# Patient Record
Sex: Female | Born: 2000 | Race: White | Hispanic: No | Marital: Single | Smoking: Never smoker
Health system: Southern US, Community
[De-identification: ages and names within clinical notes are randomized; demographics above are authoritative.]

## PROBLEM LIST (undated history)

## (undated) DIAGNOSIS — K3184 Gastroparesis: Secondary | ICD-10-CM

## (undated) DIAGNOSIS — I498 Other specified cardiac arrhythmias: Secondary | ICD-10-CM

## (undated) DIAGNOSIS — F329 Major depressive disorder, single episode, unspecified: Secondary | ICD-10-CM

## (undated) DIAGNOSIS — I951 Orthostatic hypotension: Secondary | ICD-10-CM

## (undated) DIAGNOSIS — R5382 Chronic fatigue, unspecified: Secondary | ICD-10-CM

## (undated) DIAGNOSIS — F32A Depression, unspecified: Secondary | ICD-10-CM

## (undated) DIAGNOSIS — R000 Tachycardia, unspecified: Secondary | ICD-10-CM

## (undated) DIAGNOSIS — F319 Bipolar disorder, unspecified: Secondary | ICD-10-CM

## (undated) DIAGNOSIS — F419 Anxiety disorder, unspecified: Secondary | ICD-10-CM

## (undated) DIAGNOSIS — G90A Postural orthostatic tachycardia syndrome (POTS): Secondary | ICD-10-CM

---
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
## 2018-04-23 ENCOUNTER — Emergency Department (HOSPITAL_BASED_OUTPATIENT_CLINIC_OR_DEPARTMENT_OTHER)
Admission: EM | Admit: 2018-04-23 | Discharge: 2018-04-23 | Disposition: A | Discharge status: Home / Self Care | Payer: PRIVATE HEALTH INSURANCE | Attending: Emergency Medicine | Admitting: Emergency Medicine

## 2018-04-23 ENCOUNTER — Encounter (HOSPITAL_BASED_OUTPATIENT_CLINIC_OR_DEPARTMENT_OTHER): Payer: Self-pay | Admitting: Emergency Medicine

## 2018-04-23 ENCOUNTER — Other Ambulatory Visit: Payer: Self-pay

## 2018-04-23 DIAGNOSIS — Z79899 Other long term (current) drug therapy: Secondary | ICD-10-CM | POA: Diagnosis not present

## 2018-04-23 DIAGNOSIS — R112 Nausea with vomiting, unspecified: Secondary | ICD-10-CM | POA: Insufficient documentation

## 2018-04-23 HISTORY — DX: Chronic fatigue, unspecified: R53.82

## 2018-04-23 HISTORY — DX: Other specified cardiac arrhythmias: I49.8

## 2018-04-23 HISTORY — DX: Postural orthostatic tachycardia syndrome (POTS): G90.A

## 2018-04-23 HISTORY — DX: Bipolar disorder, unspecified: F31.9

## 2018-04-23 HISTORY — DX: Tachycardia, unspecified: R00.0

## 2018-04-23 HISTORY — DX: Orthostatic hypotension: I95.1

## 2018-04-23 LAB — CBC WITH DIFFERENTIAL/PLATELET
Basophils Absolute: 0.1 10*3/uL (ref 0.0–0.1)
Basophils Relative: 1 %
Eosinophils Absolute: 0.1 10*3/uL (ref 0.0–0.7)
Eosinophils Relative: 2 %
HCT: 39.8 % (ref 36.0–46.0)
Hemoglobin: 13.2 g/dL (ref 12.0–15.0)
Lymphocytes Relative: 19 %
Lymphs Abs: 1.4 10*3/uL (ref 0.7–4.0)
MCH: 31.6 pg (ref 26.0–34.0)
MCHC: 33.2 g/dL (ref 30.0–36.0)
MCV: 95.2 fL (ref 78.0–100.0)
Monocytes Absolute: 0.6 10*3/uL (ref 0.1–1.0)
Monocytes Relative: 8 %
Neutro Abs: 5.1 10*3/uL (ref 1.7–7.7)
Neutrophils Relative %: 70 %
Platelets: 313 10*3/uL (ref 150–400)
RBC: 4.18 MIL/uL (ref 3.87–5.11)
RDW: 12.7 % (ref 11.5–15.5)
WBC: 7.3 10*3/uL (ref 4.0–10.5)

## 2018-04-23 LAB — COMPREHENSIVE METABOLIC PANEL
ALT: 14 U/L (ref 0–44)
AST: 22 U/L (ref 15–41)
Albumin: 4.5 g/dL (ref 3.5–5.0)
Alkaline Phosphatase: 70 U/L (ref 38–126)
Anion gap: 9 (ref 5–15)
BUN: 12 mg/dL (ref 6–20)
CO2: 26 mmol/L (ref 22–32)
Calcium: 9.3 mg/dL (ref 8.9–10.3)
Chloride: 104 mmol/L (ref 98–111)
Creatinine, Ser: 0.68 mg/dL (ref 0.44–1.00)
GFR calc Af Amer: >60 mL/min (ref >60)
GFR calc non Af Amer: >60 mL/min (ref >60)
Glucose, Bld: 95 mg/dL (ref 70–99)
Potassium: 4.2 mmol/L (ref 3.5–5.1)
Sodium: 139 mmol/L (ref 135–145)
Total Bilirubin: 0.6 mg/dL (ref 0.3–1.2)
Total Protein: 7.4 g/dL (ref 6.5–8.1)

## 2018-04-23 LAB — URINALYSIS, ROUTINE W REFLEX MICROSCOPIC
Bilirubin Urine: NEGATIVE
Glucose, UA: NEGATIVE mg/dL
Hgb urine dipstick: NEGATIVE
Ketones, ur: NEGATIVE mg/dL
Leukocytes, UA: NEGATIVE
Nitrite: NEGATIVE
Protein, ur: NEGATIVE mg/dL
Specific Gravity, Urine: 1.015 (ref 1.005–1.030)
pH: 8.5 — ABNORMAL HIGH (ref 5.0–8.0)

## 2018-04-23 LAB — LIPASE, BLOOD: Lipase: 28 U/L (ref 11–51)

## 2018-04-23 LAB — PREGNANCY, URINE: Preg Test, Ur: NEGATIVE

## 2018-04-23 MED ORDER — SODIUM CHLORIDE 0.9 % IV BOLUS
1000.0000 mL | Freq: Once | INTRAVENOUS | Status: AC
  Administered 2018-04-23: 1000 mL via INTRAVENOUS

## 2018-04-23 MED ORDER — ONDANSETRON HCL 4 MG/2ML IJ SOLN
4.0000 mg | Freq: Once | INTRAMUSCULAR | Status: AC
  Administered 2018-04-23: 4 mg via INTRAVENOUS
  Filled 2018-04-23: qty 2

## 2018-04-23 MED ORDER — ONDANSETRON HCL 4 MG PO TABS: 4.0000 mg | ORAL_TABLET | Freq: Four times a day (QID) | ORAL | 0 refills | Status: DC

## 2018-04-23 NOTE — ED Notes (Signed)
Pt drinking water without problem.

## 2018-04-23 NOTE — ED Notes (Signed)
Pt states still unable to provide urine specimen. Water given per Engineer, site from [place], [place]

## 2018-04-23 NOTE — ED Notes (Signed)
Pt unable to void 

## 2018-04-23 NOTE — ED Triage Notes (Signed)
N/V since yesterday.  No abd pain.  Sts she has been out of her adderal x1 week and is wondering if it is withdrawal.  Waiting for rx through the mail from [place].

## 2018-04-23 NOTE — ED Provider Notes (Signed)
[facility] EMERGENCY DEPARTMENT Provider Note   CSN: [id number] Arrival date & time: 04/23/18  1303     History   Chief Complaint Chief Complaint  Patient presents with  . Emesis    HPI Amy Bradley is a 17 y.o. female with history of anxiety, bipolar disorder, POTS, chronic fatigue who presents with a 1 day history of nausea and vomiting. Patient has not been able to keep down any food or fluids.  She denies any abdominal pain or diarrhea.  She reports after a lot of vomiting, she has had some blood streaking in her emesis.  She denies any drug or alcohol use.  She reports she recently started on Adderall for her chronic fatigue syndrome and has been out of it for about a week.  She feels like she may be experiencing the symptoms as withdrawal symptoms.  She reports the medication is being mailed to her from a prescription service.  She denies eating any abnormal food.  She denies any new chest pain, shortness of breath, urinary symptoms, back pain.  No medications taken prior to arrival. No new or different foods or known sick contacts. No recent travel.  HPI  Past Medical History:  Diagnosis Date  . Anxiety   . Bipolar 1 disorder (HCC)   . Chronic fatigue   . Depression   . POTS (postural orthostatic tachycardia syndrome)     Patient Active Problem List   Diagnosis Date Noted  . Dizziness 06/23/2017  . Syncope 06/23/2017  . Chest pain 06/23/2017    History reviewed. No pertinent surgical history.   OB History   None      Home Medications    Prior to Admission medications   Medication Sig Start Date End Date Taking? Authorizing Provider  amphetamine-dextroamphetamine (ADDERALL) 10 MG tablet Take 10 mg by mouth daily with breakfast.   Yes [provider]  ARIPiprazole (ABILIFY) 10 MG tablet Take 10 mg by mouth daily.   Yes [provider]  LORazepam (ATIVAN) 0.5 MG tablet Take by mouth. 03/03/18 08/30/18 Yes [provider]    midodrine (PROAMATINE) 5 MG tablet Take 5 mg by mouth 2 (two) times daily with a meal.   Yes [provider]  pindolol (VISKEN) 5 MG tablet Take 5 mg by mouth 2 (two) times daily.   Yes [provider]  ondansetron (ZOFRAN) 4 MG tablet Take 1 tablet (4 mg total) by mouth every 6 (six) hours. 04/23/18   Aneshia Jacquet, Waylan Boga, PA-C  sertraline (ZOLOFT) 100 MG tablet Take 100 mg by mouth daily.    [provider]    Family History Family History  Problem Relation Age of Onset  . Lung cancer Father   . Heart disease Neg Hx   . Sudden Cardiac Death Neg Hx     Social History Social History   Tobacco Use  . Smoking status: Never Smoker  . Smokeless tobacco: Never Used  Substance Use Topics  . Alcohol use: No  . Drug use: No     Allergies   Sulfa antibiotics and Penicillins   Review of Systems Review of Systems  Constitutional: Negative for chills and fever.  HENT: Negative for facial swelling and sore throat.   Respiratory: Negative for shortness of breath.   Cardiovascular: Negative for chest pain.  Gastrointestinal: Positive for nausea and vomiting. Negative for abdominal pain, blood in stool and diarrhea.  Genitourinary: Negative for dysuria.  Musculoskeletal: Negative for back pain.  Skin: Negative  for rash and wound.  Neurological: Negative for headaches.  Psychiatric/Behavioral: The patient is not nervous/anxious.      Physical Exam Updated Vital Signs BP 122/70 (BP Location: Right Arm)   Pulse 76   Temp 98.8 F (37.1 C) (Oral)   Resp 18   Ht 5\' 8"  (1.727 m)   Wt 61.2 kg   LMP 03/23/2018   SpO2 100%   BMI 20.53 kg/m   Physical Exam  Constitutional: She appears well-developed and well-nourished. No distress.  HENT:  Head: Normocephalic and atraumatic.  Mouth/Throat: Oropharynx is clear and moist. No oropharyngeal exudate.  Eyes: Pupils are equal, round, and reactive to light. Conjunctivae are normal. Right eye exhibits no discharge.  Left eye exhibits no discharge. No scleral icterus.  Neck: Normal range of motion. Neck supple. No thyromegaly present.  Cardiovascular: Normal rate, regular rhythm, normal heart sounds and intact distal pulses. Exam reveals no gallop and no friction rub.  No murmur heard. Pulmonary/Chest: Effort normal and breath sounds normal. No stridor. No respiratory distress. She has no wheezes. She has no rales.  Abdominal: Soft. Bowel sounds are normal. She exhibits no distension. There is no tenderness. There is no rebound and no guarding.  Musculoskeletal: She exhibits no edema.  Lymphadenopathy:    She has no cervical adenopathy.  Neurological: She is alert. Coordination normal.  Skin: Skin is warm and dry. No rash noted. She is not diaphoretic. No pallor.  Psychiatric: She has a normal mood and affect.  Nursing note and vitals reviewed.    ED Treatments / Results  Labs (all labs ordered are listed, but only abnormal results are displayed) Labs Reviewed  URINALYSIS, ROUTINE W REFLEX MICROSCOPIC - Abnormal; Notable for the following components:      Result Value   pH 8.5 (*)    All other components within normal limits  COMPREHENSIVE METABOLIC PANEL  CBC WITH DIFFERENTIAL/PLATELET  LIPASE, BLOOD  PREGNANCY, URINE    EKG None  Radiology No results found.  Procedures Procedures (including critical care time)  Medications Ordered in ED Medications  sodium chloride 0.9 % bolus 1,000 mL (0 mLs Intravenous Stopped 04/23/18 1444)  ondansetron (ZOFRAN) injection 4 mg (4 mg Intravenous Given 04/23/18 1343)     Initial Impression / Assessment and Plan / ED Course  I have reviewed the triage vital signs and the nursing notes.  Pertinent labs & imaging results that were available during my care of the patient were reviewed by me and considered in my medical decision making (see chart for details).     Patient with viral gastroenteritis.  Low suspicion that this is a withdrawal symptom  of Adderall.  Patient denies any abdominal pain.  Abdominal exam is soft and nontender.  Labs are unremarkable.  Patient is given a liter fluids and tolerating oral fluids after Zofran in the ED.  Will discharge home with Zofran.  Progressive diet discussed.  Return precautions discussed.  Patient understands and agrees with plan.  Patient vitals stable throughout ED course and discharged in satisfactory condition.  Final Clinical Impressions(s) / ED Diagnoses   Final diagnoses:  Non-intractable vomiting with nausea, unspecified vomiting type    ED Discharge Orders         Ordered    ondansetron (ZOFRAN) 4 MG tablet  Every 6 hours     04/23/18 1609           Emi Holes, PA-C 04/23/18 1616    Raeford Razor, MD 04/25/18 [phone]

## 2018-04-23 NOTE — Discharge Instructions (Signed)
Take Zofran every 6 hours as needed for nausea or vomiting.  Begin with clear fluids and progress to bland foods such as bananas, rice, applesauce, toast and then progress your diet as tolerated.  Avoid greasy, fatty, spicy foods to begin with.  Please return the emergency department if you develop any new or worsening symptoms including intractable vomiting despite the medication, localized abdominal pain, fever over 100.4, or any other concerning symptom.

## 2018-05-06 ENCOUNTER — Encounter (HOSPITAL_BASED_OUTPATIENT_CLINIC_OR_DEPARTMENT_OTHER): Payer: Self-pay

## 2018-05-06 ENCOUNTER — Other Ambulatory Visit: Payer: Self-pay

## 2018-05-06 ENCOUNTER — Emergency Department (HOSPITAL_BASED_OUTPATIENT_CLINIC_OR_DEPARTMENT_OTHER)
Admission: EM | Admit: 2018-05-06 | Discharge: 2018-05-06 | Disposition: A | Discharge status: Home / Self Care | Payer: Managed Care, Other (non HMO) | Attending: Emergency Medicine | Admitting: Emergency Medicine

## 2018-05-06 ENCOUNTER — Emergency Department (HOSPITAL_BASED_OUTPATIENT_CLINIC_OR_DEPARTMENT_OTHER): Payer: Managed Care, Other (non HMO)

## 2018-05-06 DIAGNOSIS — Y929 Unspecified place or not applicable: Secondary | ICD-10-CM | POA: Insufficient documentation

## 2018-05-06 DIAGNOSIS — Y999 Unspecified external cause status: Secondary | ICD-10-CM | POA: Diagnosis not present

## 2018-05-06 DIAGNOSIS — Z87898 Personal history of other specified conditions: Secondary | ICD-10-CM

## 2018-05-06 DIAGNOSIS — S098XXA Other specified injuries of head, initial encounter: Secondary | ICD-10-CM | POA: Diagnosis present

## 2018-05-06 DIAGNOSIS — W19XXXA Unspecified fall, initial encounter: Secondary | ICD-10-CM | POA: Diagnosis not present

## 2018-05-06 DIAGNOSIS — Y9389 Activity, other specified: Secondary | ICD-10-CM | POA: Insufficient documentation

## 2018-05-06 DIAGNOSIS — Z79899 Other long term (current) drug therapy: Secondary | ICD-10-CM | POA: Insufficient documentation

## 2018-05-06 DIAGNOSIS — S060X1A Concussion with loss of consciousness of 30 minutes or less, initial encounter: Secondary | ICD-10-CM | POA: Insufficient documentation

## 2018-05-06 DIAGNOSIS — S0990XA Unspecified injury of head, initial encounter: Secondary | ICD-10-CM

## 2018-05-06 LAB — RAPID URINE DRUG SCREEN, HOSP PERFORMED
AMPHETAMINES: POSITIVE — AB
BENZODIAZEPINES: NOT DETECTED
Barbiturates: NOT DETECTED
Cocaine: NOT DETECTED
Opiates: NOT DETECTED
TETRAHYDROCANNABINOL: NOT DETECTED

## 2018-05-06 LAB — COMPREHENSIVE METABOLIC PANEL
ALBUMIN: 4.4 g/dL (ref 3.5–5.0)
ALT: 18 U/L (ref 0–44)
AST: 20 U/L (ref 15–41)
Alkaline Phosphatase: 73 U/L (ref 38–126)
Anion gap: 8 (ref 5–15)
BUN: 13 mg/dL (ref 6–20)
CALCIUM: 9.2 mg/dL (ref 8.9–10.3)
CHLORIDE: 103 mmol/L (ref 98–111)
CO2: 28 mmol/L (ref 22–32)
CREATININE: 0.75 mg/dL (ref 0.44–1.00)
GFR calc non Af Amer: >60 mL/min (ref >60)
GLUCOSE: 92 mg/dL (ref 70–99)
Potassium: 4.3 mmol/L (ref 3.5–5.1)
SODIUM: 139 mmol/L (ref 135–145)
Total Bilirubin: 0.6 mg/dL (ref 0.3–1.2)
Total Protein: 7.3 g/dL (ref 6.5–8.1)

## 2018-05-06 LAB — CBC WITH DIFFERENTIAL/PLATELET
BASOS PCT: 1 %
Basophils Absolute: 0 10*3/uL (ref 0.0–0.1)
EOS ABS: 0.2 10*3/uL (ref 0.0–0.7)
EOS PCT: 3 %
HCT: 38.3 % (ref 36.0–46.0)
Hemoglobin: 12.6 g/dL (ref 12.0–15.0)
LYMPHS ABS: 1.1 10*3/uL (ref 0.7–4.0)
Lymphocytes Relative: 19 %
MCH: 31.3 pg (ref 26.0–34.0)
MCHC: 32.9 g/dL (ref 30.0–36.0)
MCV: 95 fL (ref 78.0–100.0)
MONO ABS: 0.5 10*3/uL (ref 0.1–1.0)
Monocytes Relative: 8 %
NEUTROS PCT: 69 %
Neutro Abs: 4.1 10*3/uL (ref 1.7–7.7)
PLATELETS: 256 10*3/uL (ref 150–400)
RBC: 4.03 MIL/uL (ref 3.87–5.11)
RDW: 12.6 % (ref 11.5–15.5)
WBC: 5.9 10*3/uL (ref 4.0–10.5)

## 2018-05-06 LAB — URINALYSIS, ROUTINE W REFLEX MICROSCOPIC
BILIRUBIN URINE: NEGATIVE
Glucose, UA: NEGATIVE mg/dL
Hgb urine dipstick: NEGATIVE
Ketones, ur: NEGATIVE mg/dL
Leukocytes, UA: NEGATIVE
NITRITE: NEGATIVE
PH: 6.5 (ref 5.0–8.0)
Protein, ur: NEGATIVE mg/dL
SPECIFIC GRAVITY, URINE: 1.02 (ref 1.005–1.030)

## 2018-05-06 LAB — PREGNANCY, URINE: Preg Test, Ur: NEGATIVE

## 2018-05-06 LAB — ETHANOL: Alcohol, Ethyl (B): <10 mg/dL (ref <10)

## 2018-05-06 NOTE — ED Notes (Signed)
Pt returned from CT °

## 2018-05-06 NOTE — ED Notes (Signed)
Pt and visitor report that patient had multiple episodes of "myoclonic" seizure like activity. States last occurrence was this morning at approximately 1015.

## 2018-05-06 NOTE — Discharge Instructions (Addendum)
Your workup today was reassuring.  Please establish care with neurology in the area and follow up this week.  Please follow attached handout on concussions. Follow up with your PCP on Wednesday, 05/10/18 for re-evaluation.  If you develop worsening or new concerning symptoms you can return to the emergency department for re-evaluation.

## 2018-05-06 NOTE — ED Provider Notes (Signed)
[facility] EMERGENCY DEPARTMENT Provider Note   CSN: [id number] Arrival date & time: 05/06/18  1045     History   Chief Complaint Chief Complaint  Patient presents with  . Head Injury    HPI Amy Bradley is a 17 y.o. female with a history of pots, recurrent syncope, chronic dizziness, bipolar 1 disorder who presents the emergent department today for head injury.  Patient reports that on Wednesday night she was walking with a friend when she had a "pots episode" that caused her to faint and fall backwards, striking her head.  She reports she woke several minutes later with a headache and blurring of her vision.  She states her vision has since improved but she has had a ongoing headache daily since that time.  She states she is stayed indoors since Wednesday and has had several "pots episodes" where she is fainted but denies further head injury.  Her roommate/friend is present with her and states she has been by her side the entire time and confirms this.  Patient reports that last night when lying down for bed she started having full body jerks where her body would contract and all of her extremities which shake x1 movement.  She states she was awake during each of these episodes.  She reports that it is occurred at least 8 times since they began.  She reports she was awake and aware during each episode.  She denies signs of postictal.  No history of seizures.  Patient reports that she does have chronic dizziness, nausea/vomiting that occurs with her pots.  She denies any new symptoms.  She denies any current visual changes, neck pain, chest pain, shortness of breath, abdominal pain, bowel/bladder incontinence, urinary retention, extremity weakness.  She reports she is mainly wheelchair-bound secondary to her pots.  She is followed by a neurologist.  She has never been diagnosed or had seizure-like activity before.  She thinks she may have a concussion. She is here for school at  [place]. [place]   HPI  Past Medical History:  Diagnosis Date  . Anxiety   . Bipolar 1 disorder (HCC)   . Chronic fatigue   . Depression   . POTS (postural orthostatic tachycardia syndrome)     Patient Active Problem List   Diagnosis Date Noted  . Dizziness 06/23/2017  . Syncope 06/23/2017  . Chest pain 06/23/2017    History reviewed. No pertinent surgical history.   OB History   None      Home Medications    Prior to Admission medications   Medication Sig Start Date End Date Taking? Authorizing Provider  amphetamine-dextroamphetamine (ADDERALL) 10 MG tablet Take 10 mg by mouth daily with breakfast.    [provider]  ARIPiprazole (ABILIFY) 10 MG tablet Take 10 mg by mouth daily.    [provider]  LORazepam (ATIVAN) 0.5 MG tablet Take by mouth. 03/03/18 08/30/18  [provider]  midodrine (PROAMATINE) 5 MG tablet Take 5 mg by mouth 2 (two) times daily with a meal.    [provider]  ondansetron (ZOFRAN) 4 MG tablet Take 1 tablet (4 mg total) by mouth every 6 (six) hours. 04/23/18   Law, Waylan BogaAlexandra M, PA-C  pindolol (VISKEN) 5 MG tablet Take 5 mg by mouth 2 (two) times daily.    [provider]  sertraline (ZOLOFT) 100 MG tablet Take 100 mg by mouth daily.    [provider]    [facility]  [facility] Family History  Problem Relation Age of Onset  . Lung cancer Father   . Heart disease Neg Hx   . Sudden Cardiac Death Neg Hx     Social History Social History   Tobacco Use  . Smoking status: Never Smoker  . Smokeless tobacco: Never Used  Substance Use Topics  . Alcohol use: No  . Drug use: No     Allergies   Sulfa antibiotics and Penicillins   Review of Systems Review of Systems  All other systems reviewed and are negative.    Physical Exam Updated Vital Signs BP 112/75 (BP Location: Right Arm)   Pulse 72   Temp 98.8 F (37.1 C) (Oral)   Resp 16   Ht 5\' 8"  (1.727 m)   Wt 61.2 kg    SpO2 98%   BMI 20.53 kg/m   Physical Exam  Constitutional: She appears well-developed and well-nourished.  Non-toxic appearing  HENT:  Head: Normocephalic and atraumatic. Head is without raccoon's eyes and without Battle's sign.  Right Ear: Hearing, tympanic membrane, external ear and ear canal normal. Tympanic membrane is not perforated and not erythematous. No hemotympanum.  Left Ear: Hearing, tympanic membrane, external ear and ear canal normal. Tympanic membrane is not perforated and not erythematous. No hemotympanum.  Nose: Nose normal. No rhinorrhea. Right sinus exhibits no maxillary sinus tenderness and no frontal sinus tenderness. Left sinus exhibits no maxillary sinus tenderness and no frontal sinus tenderness.  Mouth/Throat: Uvula is midline, oropharynx is clear and moist and mucous membranes are normal.  No CSF otorrhea   Eyes: Pupils are equal, round, and reactive to light. Conjunctivae, EOM and lids are normal. Right eye exhibits no discharge. Left eye exhibits no discharge. Right conjunctiva is not injected. Left conjunctiva is not injected. No scleral icterus. Right eye exhibits normal extraocular motion and no nystagmus. Left eye exhibits normal extraocular motion and no nystagmus.  Neck: Trachea normal, normal range of motion, full passive range of motion without pain and phonation normal. Neck supple. No spinous process tenderness and no muscular tenderness present. No neck rigidity. No tracheal deviation and normal range of motion present.  No C-spine ttp or step offs.   Cardiovascular: Normal rate, regular rhythm, normal heart sounds and intact distal pulses.  Pulses:      Radial pulses are 2+ on the right side, and 2+ on the left side.       Dorsalis pedis pulses are 2+ on the right side, and 2+ on the left side.       Posterior tibial pulses are 2+ on the right side, and 2+ on the left side.  Pulmonary/Chest: Effort normal and breath sounds normal. No respiratory  distress.  Neurological: She is alert. She has normal strength and normal reflexes. No cranial nerve deficit or sensory deficit. She displays a negative Romberg sign. Coordination and gait normal.  Reflex Scores:      Bicep reflexes are 2+ on the right side and 2+ on the left side.      Patellar reflexes are 2+ on the right side and 2+ on the left side.      Achilles reflexes are 2+ on the right side and 2+ on the left side. Mental Status: Alert, oriented, thought content appropriate, able to give a coherent history. Speech fluent without evidence of aphasia. Able to follow 2 step commands without difficulty. Cranial Nerves: II: Peripheral visual fields grossly normal, pupils equal, round, reactive to light III,IV, VI: ptosis not present, extra-ocular  motions intact bilaterally V,VII: smile symmetric, eyebrows raise symmetric, facial light touch sensation equal VIII: hearing grossly normal to voice X: uvula elevates symmetrically XI: bilateral shoulder shrug symmetric and strong XII: midline tongue extension without fassiculations Motor: Normal tone. 5/5 in upper and lower extremities bilaterally including strong and equal grip strength and dorsiflexion/plantar flexion Sensory: Sensation intact to light touch in all extremities.  Deep Tendon Reflexes: 2+ and symmetric in the biceps and patella Cerebellar: normal finger-to-nose with bilateral upper extremities. Normal heel-to -shin balance bilaterally of the lower extremity. No pronator drift.  CV: distal pulses palpable throughout  Skin: She is not diaphoretic. No pallor.  Psychiatric: She has a normal mood and affect.  Nursing note and vitals reviewed.    ED Treatments / Results  Labs (all labs ordered are listed, but only abnormal results are displayed) Labs Reviewed  RAPID URINE DRUG SCREEN, HOSP PERFORMED - Abnormal; Notable for the following components:      Result Value   Amphetamines POSITIVE (*)    All other  components within normal limits  COMPREHENSIVE METABOLIC PANEL  ETHANOL  URINALYSIS, ROUTINE W REFLEX MICROSCOPIC  PREGNANCY, URINE  CBC WITH DIFFERENTIAL/PLATELET  CBC WITH DIFFERENTIAL/PLATELET    EKG None  Radiology Ct Head Wo Contrast  Result Date: 05/06/2018 CLINICAL DATA:  History of POTS with recent syncopal event and head injury, initial encounter EXAM: CT HEAD WITHOUT CONTRAST CT CERVICAL SPINE WITHOUT CONTRAST TECHNIQUE: Multidetector CT imaging of the head and cervical spine was performed following the standard protocol without intravenous contrast. Multiplanar CT image reconstructions of the cervical spine were also generated. COMPARISON:  None. FINDINGS: CT HEAD FINDINGS Brain: No evidence of acute infarction, hemorrhage, hydrocephalus, extra-axial collection or mass lesion/mass effect. Vascular: No hyperdense vessel or unexpected calcification. Skull: Normal. Negative for fracture or focal lesion. Sinuses/Orbits: No acute finding. Other: None. CT CERVICAL SPINE FINDINGS Alignment: Mild loss of the normal cervical lordosis is noted likely related to muscular spasm. Skull base and vertebrae: 7 cervical segments are well visualized. Vertebral body height is well maintained. No acute fracture or acute facet abnormality is identified. Soft tissues and spinal canal: Surrounding soft tissues are within normal limits. Upper chest: Visualized upper chest is unremarkable. Other: None IMPRESSION: CT of the head: Normal head CT. CT of cervical spine: Changes of mild muscular spasm without acute bony abnormality. Electronically Signed   By: Alcide Clever M.D.   On: 05/06/2018 12:35   Ct Cervical Spine Wo Contrast  Result Date: 05/06/2018 CLINICAL DATA:  History of POTS with recent syncopal event and head injury, initial encounter EXAM: CT HEAD WITHOUT CONTRAST CT CERVICAL SPINE WITHOUT CONTRAST TECHNIQUE: Multidetector CT imaging of the head and cervical spine was performed following the standard  protocol without intravenous contrast. Multiplanar CT image reconstructions of the cervical spine were also generated. COMPARISON:  None. FINDINGS: CT HEAD FINDINGS Brain: No evidence of acute infarction, hemorrhage, hydrocephalus, extra-axial collection or mass lesion/mass effect. Vascular: No hyperdense vessel or unexpected calcification. Skull: Normal. Negative for fracture or focal lesion. Sinuses/Orbits: No acute finding. Other: None. CT CERVICAL SPINE FINDINGS Alignment: Mild loss of the normal cervical lordosis is noted likely related to muscular spasm. Skull base and vertebrae: 7 cervical segments are well visualized. Vertebral body height is well maintained. No acute fracture or acute facet abnormality is identified. Soft tissues and spinal canal: Surrounding soft tissues are within normal limits. Upper chest: Visualized upper chest is unremarkable. Other: None IMPRESSION: CT of the head: Normal head CT.  CT of cervical spine: Changes of mild muscular spasm without acute bony abnormality. Electronically Signed   By: Alcide Clever M.D.   On: 05/06/2018 12:35    Procedures Procedures (including critical care time)  Medications Ordered in ED Medications - No data to display   Initial Impression / Assessment and Plan / ED Course  I have reviewed the triage vital signs and the nursing notes.  Pertinent labs & imaging results that were available during my care of the patient were reviewed by me and considered in my medical decision making (see chart for details).     17 year old female with a history of pots, recurrent syncope, chronic dizziness presenting for head injury.  Patient reports that on Wednesday she had a "pots episode" where she syncopized and fell backwards hitting her head.  She reports a headache since that time.  No further head injuries since that event.  Patient reports that last night she started having episodes were all of her muscles would tense and she would shake x1  movement.  She notes that she was awake during these episodes.  She denies postictal symptoms.  She denies any focal deficits.  No associated visual changes, vertigo, nausea/vomiting, neck pain, chest pain, abdominal pain, bowel/bladder incontinence, urinary retention.  Patient with normal neurologic exam.  Vital signs are reassuring. No hx seizures.   Labs and imaging reviewed and reassuring.  Patient with no evidence of focal deficits on physical exam and is at mental baseline.  Advised patient to follow-up with neurologist.  Her neurologist is in [place] so have referred her to let our neurology.  Patient has tried restrictions until cleared by a neurologist. She states that she has these restrictions already secondary to her pots. Will tx with concussion protocol and have follow up with pcp in 1 week as well. Patient verbalizes understanding.  Time was given for all questions to be answered.  Patient is hemodynamically stable in no acute distress and appears safe for discharge.  Case discussed with Dr. Deretha Emory who is in agreement with plan.  Final Clinical Impressions(s) / ED Diagnoses   Final diagnoses:  Injury of head, initial encounter  History of syncope  Concussion with loss of consciousness of 30 minutes or less, initial encounter    ED Discharge Orders    None       Princella Pellegrini 05/06/18 1724    Vanetta Mulders, MD 05/07/18 0730

## 2018-05-06 NOTE — ED Triage Notes (Signed)
Pt reports head injury a few days ago. Sts ?possible seizure like activity last night and this morning.

## 2018-05-06 NOTE — ED Notes (Signed)
Patient transported to CT 

## 2018-05-06 NOTE — ED Notes (Signed)
ED Provider at bedside. 

## 2018-05-06 NOTE — ED Notes (Signed)
Lab reports that CBC was hemolyzed. New specimen collected.

## 2018-07-01 ENCOUNTER — Encounter (HOSPITAL_COMMUNITY): Payer: Self-pay | Admitting: Emergency Medicine

## 2018-07-01 ENCOUNTER — Other Ambulatory Visit: Payer: Self-pay

## 2018-07-01 ENCOUNTER — Emergency Department (HOSPITAL_COMMUNITY)
Admission: EM | Admit: 2018-07-01 | Discharge: 2018-07-01 | Disposition: A | Discharge status: Home / Self Care | Payer: Managed Care, Other (non HMO) | Attending: Emergency Medicine | Admitting: Emergency Medicine

## 2018-07-01 DIAGNOSIS — Z79899 Other long term (current) drug therapy: Secondary | ICD-10-CM | POA: Insufficient documentation

## 2018-07-01 DIAGNOSIS — R45851 Suicidal ideations: Secondary | ICD-10-CM

## 2018-07-01 DIAGNOSIS — F314 Bipolar disorder, current episode depressed, severe, without psychotic features: Secondary | ICD-10-CM | POA: Insufficient documentation

## 2018-07-01 LAB — SALICYLATE LEVEL

## 2018-07-01 LAB — RAPID URINE DRUG SCREEN, HOSP PERFORMED
Amphetamines: NOT DETECTED
BARBITURATES: NOT DETECTED
Benzodiazepines: NOT DETECTED
Cocaine: NOT DETECTED
OPIATES: NOT DETECTED
Tetrahydrocannabinol: NOT DETECTED

## 2018-07-01 LAB — COMPREHENSIVE METABOLIC PANEL
ALT: 13 U/L (ref 0–44)
AST: 19 U/L (ref 15–41)
Albumin: 4.3 g/dL (ref 3.5–5.0)
Alkaline Phosphatase: 70 U/L (ref 38–126)
Anion gap: 7 (ref 5–15)
BUN: 13 mg/dL (ref 6–20)
CHLORIDE: 108 mmol/L (ref 98–111)
CO2: 24 mmol/L (ref 22–32)
Calcium: 9.3 mg/dL (ref 8.9–10.3)
Creatinine, Ser: 0.73 mg/dL (ref 0.44–1.00)
GFR calc Af Amer: >60 mL/min (ref >60)
Glucose, Bld: 95 mg/dL (ref 70–99)
POTASSIUM: 3.8 mmol/L (ref 3.5–5.1)
SODIUM: 139 mmol/L (ref 135–145)
TOTAL PROTEIN: 6.9 g/dL (ref 6.5–8.1)
Total Bilirubin: 0.3 mg/dL (ref 0.3–1.2)

## 2018-07-01 LAB — CBC
HEMATOCRIT: 38.5 % (ref 36.0–46.0)
Hemoglobin: 12.5 g/dL (ref 12.0–15.0)
MCH: 31.4 pg (ref 26.0–34.0)
MCHC: 32.5 g/dL (ref 30.0–36.0)
MCV: 96.7 fL (ref 80.0–100.0)
PLATELETS: 327 10*3/uL (ref 150–400)
RBC: 3.98 MIL/uL (ref 3.87–5.11)
RDW: 12.1 % (ref 11.5–15.5)
WBC: 5.5 10*3/uL (ref 4.0–10.5)
nRBC: 0 % (ref 0.0–0.2)

## 2018-07-01 LAB — I-STAT BETA HCG BLOOD, ED (MC, WL, AP ONLY): I-stat hCG, quantitative: <5 m[IU]/mL (ref <5)

## 2018-07-01 LAB — ETHANOL

## 2018-07-01 LAB — ACETAMINOPHEN LEVEL

## 2018-07-01 MED ORDER — MIDODRINE HCL 5 MG PO TABS
5.0000 mg | ORAL_TABLET | Freq: Two times a day (BID) | ORAL | Status: DC
  Administered 2018-07-01: 5 mg via ORAL
  Filled 2018-07-01: qty 1

## 2018-07-01 MED ORDER — PINDOLOL 5 MG PO TABS
2.5000 mg | ORAL_TABLET | Freq: Two times a day (BID) | ORAL | Status: DC
  Administered 2018-07-01: 2.5 mg via ORAL
  Filled 2018-07-01: qty 1

## 2018-07-01 MED ORDER — SERTRALINE HCL 100 MG PO TABS
200.0000 mg | ORAL_TABLET | Freq: Every day | ORAL | Status: DC
  Administered 2018-07-01: 200 mg via ORAL
  Filled 2018-07-01: qty 2

## 2018-07-01 MED ORDER — ARIPIPRAZOLE 10 MG PO TABS
20.0000 mg | ORAL_TABLET | Freq: Every day | ORAL | Status: DC
  Administered 2018-07-01: 20 mg via ORAL
  Filled 2018-07-01: qty 2

## 2018-07-01 MED ORDER — LORAZEPAM 0.5 MG PO TABS: 0.5000 mg | ORAL_TABLET | ORAL | Status: DC | PRN

## 2018-07-01 MED ORDER — AMPHETAMINE-DEXTROAMPHETAMINE 10 MG PO TABS
10.0000 mg | ORAL_TABLET | Freq: Every day | ORAL | Status: DC
  Administered 2018-07-01: 10 mg via ORAL
  Filled 2018-07-01: qty 1

## 2018-07-01 NOTE — ED Notes (Signed)
Pt's friend - Earnest Conroy - [phone].

## 2018-07-01 NOTE — ED Notes (Signed)
Pt aware of tx plan to discuss Abilify monthly injections.

## 2018-07-01 NOTE — ED Notes (Signed)
Telepsych being performed. 

## 2018-07-01 NOTE — ED Provider Notes (Signed)
Received a call from Or[place] from [facility].  After interviewing the patient and getting collateral information from her roommate they feel that she can be safely discharged back to home.  They are only waiting for her ride and clothes.  This may be later today.   Terrilee Files, MD 07/01/18 [phone]

## 2018-07-01 NOTE — ED Notes (Signed)
Pt aware of tx plan - d/c to home. Aware her roommate w/vehicle will not be back in town until 1830 this evening. Pt states she will get an Benedetto Goad - staff will give her blue paper scrubs and blanket d/t her roommate took all of her belongings except cell phone last evening.

## 2018-07-01 NOTE — Discharge Instructions (Signed)
You were evaluated in the emergency department for suicidal thoughts.  You are evaluated by behavioral health and they felt that you could be discharged safely.  Please return if any concerns.

## 2018-07-01 NOTE — Progress Notes (Signed)
Patient is seen by me via tele-psych and have consulted with Dr. Lucianne Muss.  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  She reports today that there is no plan or intent of harming herself and she consistently has passive SI.  She denies any homicidal ideations and denies any hallucinations.  She reports that she does have a roommate that is very well aware of her history and that her roommate helps assist her with her medications and taking care of her.  She reports that she does not feel that she needs to be in the hospital and that due to a past traumatic experience inside of the hospital it would not be beneficial to her.  She reports that her psychiatrist yesterday increased her Abilify to 20 mg a day and she feels that that may assist her.  She reports that she only came to the hospital because of some manic symptoms and she was becoming worried.  She allowed me to contact her roommate Earnest Conroy, [phone], and her roommate reported the exact same story and stated that she felt that the patient was safe to be returned home and that the patient is usually very cautious about her behavior and her mental health issues.  She states that the patient would definitely let us know if she felt that she needed to be in the hospital.  Patient's roommate also states that they have removed all dangerous weapons from the apartment.  She also reports that there are numerous roommates which can be there to assist with safety for the patient.  They are also interested in potentially the patient going on Abilify injectable, such as Abilify Maintena, and they stated that they would request that information from her psychiatrist after discharge.  At this time patient does not meet inpatient criteria and patient is psychiatrically cleared.  I have contacted Dr. Charm Barges and notified him of the recommendations.

## 2018-07-01 NOTE — ED Notes (Signed)
Breakfast tray ordered 

## 2018-07-01 NOTE — ED Provider Notes (Signed)
[facility] EMERGENCY DEPARTMENT Provider Note   CSN: [id number] Arrival date & time: 07/01/18  0330     History   Chief Complaint Chief Complaint  Patient presents with  . Suicidal    HPI Amy Bradley is a 17 y.o. female.  17 yo F with a chief complaint of suicidal thoughts.  She felt she had no real intent has no plan.  She has been having some episodes of mania over the past couple days.  She has had some delusions and some hallucinations.  Does not feel that she is having this currently.  She called the mobile crisis line who felt she needed to come into the ED.  She denies chest pain abdominal pain shortness of breath vomiting or diarrhea.  She recently had increased 1 of her antipsychotic medications.  Has a history of bipolar disorder.  The history is provided by the patient.  Illness  This is a new problem. The current episode started 2 days ago. The problem occurs constantly. The problem has been gradually improving. Pertinent negatives include no chest pain, no headaches and no shortness of breath. Nothing aggravates the symptoms. Nothing relieves the symptoms. She has tried nothing for the symptoms. The treatment provided no relief.    Past Medical History:  Diagnosis Date  . Anxiety   . Bipolar 1 disorder (HCC)   . Chronic fatigue   . Depression   . POTS (postural orthostatic tachycardia syndrome)     Patient Active Problem List   Diagnosis Date Noted  . Dizziness 06/23/2017  . Syncope 06/23/2017  . Chest pain 06/23/2017    History reviewed. No pertinent surgical history.   OB History   None      Home Medications    Prior to Admission medications   Medication Sig Start Date End Date Taking? Authorizing Provider  amphetamine-dextroamphetamine (ADDERALL) 10 MG tablet Take 10 mg by mouth daily with breakfast.   Yes [provider]  ARIPiprazole (ABILIFY) 10 MG tablet Take 20 mg by mouth daily.    Yes [provider]  LORazepam (ATIVAN) 0.5 MG tablet Take 0.5 mg by mouth every 4 (four) hours as needed for anxiety.  03/03/18 08/30/18 Yes [provider]  midodrine (PROAMATINE) 5 MG tablet Take 5 mg by mouth 2 (two) times daily with a meal.   Yes [provider]  pindolol (VISKEN) 5 MG tablet Take 2.5 mg by mouth 2 (two) times daily.    Yes [provider]  sertraline (ZOLOFT) 100 MG tablet Take 200 mg by mouth daily.    Yes [provider]  ondansetron (ZOFRAN) 4 MG tablet Take 1 tablet (4 mg total) by mouth every 6 (six) hours. Patient not taking: Reported on 07/01/2018 04/23/18   Emi Holes, PA-C    Family History Family History  Problem Relation Age of Onset  . Lung cancer Father   . Heart disease Neg Hx   . Sudden Cardiac Death Neg Hx     Social History Social History   Tobacco Use  . Smoking status: Never Smoker  . Smokeless tobacco: Never Used  Substance Use Topics  . Alcohol use: No  . Drug use: No     Allergies   Sulfa antibiotics and Penicillins   Review of Systems Review of Systems  Constitutional: Negative for chills and fever.  HENT: Negative for congestion and rhinorrhea.   Eyes: Negative for redness and visual disturbance.  Respiratory: Negative for shortness of  breath and wheezing.   Cardiovascular: Negative for chest pain and palpitations.  Gastrointestinal: Negative for nausea and vomiting.  Genitourinary: Negative for dysuria and urgency.  Musculoskeletal: Negative for arthralgias and myalgias.  Skin: Negative for pallor and wound.  Neurological: Negative for dizziness and headaches.  Psychiatric/Behavioral: Positive for hallucinations, sleep disturbance and suicidal ideas.     Physical Exam Updated Vital Signs BP 117/76 (BP Location: Right Arm)   Pulse 81   Temp 98 F (36.7 C) (Oral)   Resp 18   Ht 5\' 8"  (1.727 m)   SpO2 99%   BMI 20.53 kg/m   Physical Exam  Constitutional: She is oriented to person, place, and  time. She appears well-developed and well-nourished. No distress.  HENT:  Head: Normocephalic and atraumatic.  Eyes: Pupils are equal, round, and reactive to light. EOM are normal.  Neck: Normal range of motion. Neck supple.  Cardiovascular: Normal rate and regular rhythm. Exam reveals no gallop and no friction rub.  No murmur heard. Pulmonary/Chest: Effort normal. She has no wheezes. She has no rales.  Abdominal: Soft. She exhibits no distension. There is no tenderness.  Musculoskeletal: She exhibits no edema or tenderness.  Neurological: She is alert and oriented to person, place, and time.  Skin: Skin is warm and dry. She is not diaphoretic.  Psychiatric: Her behavior is normal. Her mood appears anxious. Her affect is blunt. She expresses no homicidal and no suicidal ideation. She expresses no suicidal plans and no homicidal plans.  Nursing note and vitals reviewed.    ED Treatments / Results  Labs (all labs ordered are listed, but only abnormal results are displayed) Labs Reviewed  ACETAMINOPHEN LEVEL - Abnormal; Notable for the following components:      Result Value   Acetaminophen (Tylenol), Serum <10 (*)    All other components within normal limits  COMPREHENSIVE METABOLIC PANEL  ETHANOL  SALICYLATE LEVEL  CBC  RAPID URINE DRUG SCREEN, HOSP PERFORMED  I-STAT BETA HCG BLOOD, ED ([facility], [facility], [facility] ONLY)    EKG None  Radiology No results found.  Procedures Procedures (including critical care time)  Medications Ordered in ED Medications  amphetamine-dextroamphetamine (ADDERALL) tablet 10 mg (has no administration in time range)  ARIPiprazole (ABILIFY) tablet 20 mg (has no administration in time range)  LORazepam (ATIVAN) tablet 0.5 mg (has no administration in time range)  midodrine (PROAMATINE) tablet 5 mg (has no administration in time range)  pindolol (VISKEN) tablet 2.5 mg (has no administration in time range)  sertraline (ZOLOFT) tablet 200 mg (has no administration  in time range)     Initial Impression / Assessment and Plan / ED Course  I have reviewed the triage vital signs and the nursing notes.  Pertinent labs & imaging results that were available during my care of the patient were reviewed by me and considered in my medical decision making (see chart for details).     17 yo F with a significant past medical history of bipolar disorder comes in with a chief complaint of suicidal thoughts.  She felt that she had no real intent but was having some psychosis over the past couple days.  Discussed with the mobile crisis line who felt that they need to be evaluated in the emergency department.  She denies any medical complaint.  I feel she is medically clear at this time.  Her routine lab work is returned with no concerning finding, hemoglobin is normal, white count is normal, no drugs found on rapid urine  drug screen.  CMP without electrolyte abnormality.  Alcohol Tylenol and salicylates are all negative.  I received a call from [facility] from TTS, will have the patient reassessed by psych in the morning.   The patients results and plan were reviewed and discussed.   Any x-rays performed were independently reviewed by myself.   Differential diagnosis were considered with the presenting HPI.  Medications  amphetamine-dextroamphetamine (ADDERALL) tablet 10 mg (has no administration in time range)  ARIPiprazole (ABILIFY) tablet 20 mg (has no administration in time range)  LORazepam (ATIVAN) tablet 0.5 mg (has no administration in time range)  midodrine (PROAMATINE) tablet 5 mg (has no administration in time range)  pindolol (VISKEN) tablet 2.5 mg (has no administration in time range)  sertraline (ZOLOFT) tablet 200 mg (has no administration in time range)    Vitals:   07/01/18 0346 07/01/18 0347 07/01/18 0605  BP: 122/75  117/76  Pulse: 79  81  Resp: 18  18  Temp: 98 F (36.7 C)  98 F (36.7 C)  TempSrc: Oral  Oral  SpO2: 98%  99%  Height:  5\' 8"   (1.727 m)     Final diagnoses:  Suicidal ideation     Final Clinical Impressions(s) / ED Diagnoses   Final diagnoses:  Suicidal ideation    ED Discharge Orders    None       Melene Plan, DO 07/01/18 0981
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
## 2018-07-01 NOTE — ED Triage Notes (Signed)
  Patient states she was having suicidal ideation around 2300 last night and called the mobile crisis line.. Patient states she did not have a plan but felt like she wanted to die.  No self harm or substance abuse.  Hx bipolar disorder, PTSD, anxiety.  No pain. VSS.  Patient is here with representative from the crisis center.

## 2018-07-01 NOTE — ED Notes (Signed)
  Patient has been placed in purple scrubs and belongings removed from the room.  Patient has been wanded by security.

## 2018-07-01 NOTE — ED Notes (Signed)
Contacted EDP for home medication orders

## 2018-07-01 NOTE — ED Notes (Addendum)
Cell phone returned to pt - Pt signed verifying present. Pt's friend had taken all of her other belongings.

## 2020-07-10 ENCOUNTER — Encounter (HOSPITAL_COMMUNITY): Payer: Self-pay

## 2020-07-10 ENCOUNTER — Other Ambulatory Visit: Payer: Self-pay

## 2020-07-10 DIAGNOSIS — R519 Headache, unspecified: Secondary | ICD-10-CM | POA: Insufficient documentation

## 2020-07-10 NOTE — ED Triage Notes (Signed)
Pt sts a migraine that has been getting worse for 3 hours. C/o generalized ha, dizziness, light sensitivity, and a "drunk like feeling".

## 2020-07-11 ENCOUNTER — Emergency Department (HOSPITAL_COMMUNITY)
Admission: EM | Admit: 2020-07-11 | Discharge: 2020-07-11 | Disposition: A | Discharge status: Home / Self Care | Payer: Managed Care, Other (non HMO) | Attending: Emergency Medicine | Admitting: Emergency Medicine

## 2020-07-11 ENCOUNTER — Emergency Department (HOSPITAL_COMMUNITY): Payer: Managed Care, Other (non HMO)

## 2020-07-11 ENCOUNTER — Encounter (HOSPITAL_COMMUNITY): Payer: Self-pay | Admitting: Emergency Medicine

## 2020-07-11 DIAGNOSIS — G43809 Other migraine, not intractable, without status migrainosus: Secondary | ICD-10-CM

## 2020-07-11 LAB — COMPREHENSIVE METABOLIC PANEL
ALT: 16 U/L (ref 0–44)
AST: 18 U/L (ref 15–41)
Albumin: 4.7 g/dL (ref 3.5–5.0)
Alkaline Phosphatase: 60 U/L (ref 38–126)
Anion gap: 11 (ref 5–15)
BUN: 11 mg/dL (ref 6–20)
CO2: 22 mmol/L (ref 22–32)
Calcium: 9.1 mg/dL (ref 8.9–10.3)
Chloride: 102 mmol/L (ref 98–111)
Creatinine, Ser: 0.71 mg/dL (ref 0.44–1.00)
GFR, Estimated: >60 mL/min (ref >60)
Glucose, Bld: 107 mg/dL — ABNORMAL HIGH (ref 70–99)
Potassium: 3.7 mmol/L (ref 3.5–5.1)
Sodium: 135 mmol/L (ref 135–145)
Total Bilirubin: 0.2 mg/dL — ABNORMAL LOW (ref 0.3–1.2)
Total Protein: 7.4 g/dL (ref 6.5–8.1)

## 2020-07-11 LAB — CBC
HCT: 38.1 % (ref 36.0–46.0)
Hemoglobin: 12.6 g/dL (ref 12.0–15.0)
MCH: 31.6 pg (ref 26.0–34.0)
MCHC: 33.1 g/dL (ref 30.0–36.0)
MCV: 95.5 fL (ref 80.0–100.0)
Platelets: 343 10*3/uL (ref 150–400)
RBC: 3.99 MIL/uL (ref 3.87–5.11)
RDW: 12.3 % (ref 11.5–15.5)
WBC: 8.8 10*3/uL (ref 4.0–10.5)
nRBC: 0 % (ref 0.0–0.2)

## 2020-07-11 LAB — POC URINE PREG, ED: Preg Test, Ur: NEGATIVE

## 2020-07-11 MED ORDER — DIPHENHYDRAMINE HCL 12.5 MG/5ML PO ELIX
12.5000 mg | ORAL_SOLUTION | Freq: Once | ORAL | Status: AC
  Administered 2020-07-11: 12.5 mg via ORAL
  Filled 2020-07-11: qty 5

## 2020-07-11 MED ORDER — KETOROLAC TROMETHAMINE 60 MG/2ML IM SOLN
60.0000 mg | Freq: Once | INTRAMUSCULAR | Status: AC
  Administered 2020-07-11: 60 mg via INTRAMUSCULAR
  Filled 2020-07-11: qty 2

## 2020-07-11 MED ORDER — METOCLOPRAMIDE HCL 10 MG PO TABS
10.0000 mg | ORAL_TABLET | Freq: Once | ORAL | Status: AC
  Administered 2020-07-11: 10 mg via ORAL
  Filled 2020-07-11: qty 1

## 2020-07-11 NOTE — ED Provider Notes (Signed)
[facility]-EMERGENCY DEPT Provider Note   CSN: [id number] Arrival date & time: 07/10/20  2311     History Chief Complaint  Patient presents with  . Headache    Amy Bradley is a 19 y.o. female.  The history is provided by the patient.  Headache Pain location:  Frontal Quality:  Dull Radiates to:  Does not radiate Severity currently:  9/10 Severity at highest:  9/10 Onset quality:  Gradual Timing:  Constant Progression:  Unchanged Chronicity:  Recurrent Similar to prior headaches: yes   Context: not caffeine, not coughing and not stress   Relieved by:  Nothing Worsened by:  Nothing Ineffective treatments:  None tried Associated symptoms: no abdominal pain, no back pain, no blurred vision, no congestion, no cough, no diarrhea, no dizziness, no drainage, no ear pain, no eye pain, no facial pain, no fatigue, no fever, no focal weakness, no hearing loss, no loss of balance, no myalgias, no nausea, no near-syncope, no neck pain, no neck stiffness, no numbness, no paresthesias, no photophobia, no seizures, no sinus pressure, no sore throat, no swollen glands, no syncope, no tingling, no URI, no visual change, no vomiting and no weakness   Risk factors: no anger        Past Medical History:  Diagnosis Date  . Anxiety   . Bipolar 1 disorder (HCC)   . Chronic fatigue   . Depression   . POTS (postural orthostatic tachycardia syndrome)     Patient Active Problem List   Diagnosis Date Noted  . Dizziness 06/23/2017  . Syncope 06/23/2017  . Chest pain 06/23/2017    History reviewed. No pertinent surgical history.   OB History   No obstetric history on file.     Family History  Problem Relation Age of Onset  . Lung cancer Father   . Heart disease Neg Hx   . Sudden Cardiac Death Neg Hx     Social History   Tobacco Use  . Smoking status: Never Smoker  . Smokeless tobacco: Never Used  Vaping Use  . Vaping Use: Never used  Substance Use  Topics  . Alcohol use: No  . Drug use: No    Home Medications Prior to Admission medications   Medication Sig Start Date End Date Taking? Authorizing Provider  amphetamine-dextroamphetamine (ADDERALL) 10 MG tablet Take 10 mg by mouth daily with breakfast.    [provider]  ARIPiprazole (ABILIFY) 10 MG tablet Take 20 mg by mouth daily.     [provider]  midodrine (PROAMATINE) 5 MG tablet Take 5 mg by mouth 2 (two) times daily with a meal.    [provider]  ondansetron (ZOFRAN) 4 MG tablet Take 1 tablet (4 mg total) by mouth every 6 (six) hours. Patient not taking: Reported on 07/01/2018 04/23/18   Emi HolesLaw, Alexandra M, PA-C  pindolol (VISKEN) 5 MG tablet Take 2.5 mg by mouth 2 (two) times daily.     [provider]  sertraline (ZOLOFT) 100 MG tablet Take 200 mg by mouth daily.     [provider]    Allergies    Sulfa antibiotics and Penicillins  Review of Systems   Review of Systems  Constitutional: Negative for fatigue and fever.  HENT: Negative for congestion, ear pain, hearing loss, postnasal drip, sinus pressure and sore throat.   Eyes: Negative for blurred vision, photophobia and pain.  Respiratory: Negative for cough.   Cardiovascular: Negative for chest pain, syncope and near-syncope.  Gastrointestinal:  Negative for abdominal pain, diarrhea, nausea and vomiting.  Genitourinary: Negative for difficulty urinating.  Musculoskeletal: Negative for back pain, myalgias, neck pain and neck stiffness.  Neurological: Positive for headaches. Negative for dizziness, focal weakness, seizures, weakness, numbness, paresthesias and loss of balance.  Psychiatric/Behavioral: Negative for agitation.  All other systems reviewed and are negative.   Physical Exam Updated Vital Signs BP 102/60   Pulse 84   Temp 98.1 F (36.7 C) (Oral)   Resp 19   Ht 5\' 8"  (1.727 m)   Wt 72.6 kg   SpO2 97%   BMI 24.33 kg/m   Physical Exam Vitals and nursing  note reviewed.  Constitutional:      General: She is not in acute distress.    Appearance: Normal appearance.  HENT:     Head: Normocephalic and atraumatic.     Nose: Nose normal.  Eyes:     Extraocular Movements: Extraocular movements intact.     Conjunctiva/sclera: Conjunctivae normal.     Pupils: Pupils are equal, round, and reactive to light.     Comments: Disks sharp, no proptosis, intact cognition   Cardiovascular:     Rate and Rhythm: Normal rate and regular rhythm.     Pulses: Normal pulses.     Heart sounds: Normal heart sounds.  Pulmonary:     Effort: Pulmonary effort is normal.     Breath sounds: Normal breath sounds.  Abdominal:     General: Abdomen is flat. Bowel sounds are normal.     Palpations: Abdomen is soft.     Tenderness: There is no abdominal tenderness. There is no guarding or rebound.  Musculoskeletal:        General: Normal range of motion.     Cervical back: Normal range of motion and neck supple.  Skin:    General: Skin is warm and dry.     Capillary Refill: Capillary refill takes less than 2 seconds.     Comments: Old cut marks on BUE and BLE  Neurological:     General: No focal deficit present.     Mental Status: She is alert and oriented to person, place, and time.     GCS: GCS eye subscore is 4. GCS verbal subscore is 5. GCS motor subscore is 6.     Cranial Nerves: No cranial nerve deficit.     Motor: No weakness.     Deep Tendon Reflexes: Reflexes normal.  Psychiatric:        Mood and Affect: Mood normal.        Behavior: Behavior normal.     ED Results / Procedures / Treatments   Labs (all labs ordered are listed, but only abnormal results are displayed) Results for orders placed or performed during the hospital encounter of 07/11/20  CBC  Result Value Ref Range   WBC 8.8 4.0 - 10.5 K/uL   RBC 3.99 3.87 - 5.11 MIL/uL   Hemoglobin 12.6 12.0 - 15.0 g/dL   HCT 07/13/20 36 - 46 %   MCV 95.5 80.0 - 100.0 fL   MCH 31.6 26.0 - 34.0 pg    MCHC 33.1 30.0 - 36.0 g/dL   RDW 70.9 62.8 - 36.6 %   Platelets 343 150 - 400 K/uL   nRBC 0.0 0.0 - 0.2 %  Comprehensive metabolic panel  Result Value Ref Range   Sodium 135 135 - 145 mmol/L   Potassium 3.7 3.5 - 5.1 mmol/L   Chloride 102 98 - 111 mmol/L  CO2 22 22 - 32 mmol/L   Glucose, Bld 107 (H) 70 - 99 mg/dL   BUN 11 6 - 20 mg/dL   Creatinine, Ser 0.94 0.44 - 1.00 mg/dL   Calcium 9.1 8.9 - 70.9 mg/dL   Total Protein 7.4 6.5 - 8.1 g/dL   Albumin 4.7 3.5 - 5.0 g/dL   AST 18 15 - 41 U/L   ALT 16 0 - 44 U/L   Alkaline Phosphatase 60 38 - 126 U/L   Total Bilirubin 0.2 (L) 0.3 - 1.2 mg/dL   GFR, Estimated >62 >83 mL/min   Anion gap 11 5 - 15  POC urine preg, ED (not at [facility])  Result Value Ref Range   Preg Test, Ur NEGATIVE NEGATIVE   CT Head Wo Contrast  Result Date: 07/11/2020 CLINICAL DATA:  Worsening migraine for several hours, disease this and light sensitivity, history of bipolar disorder EXAM: CT HEAD WITHOUT CONTRAST TECHNIQUE: Contiguous axial images were obtained from the base of the skull through the vertex without intravenous contrast. COMPARISON:  CT head and cervical spine 05/06/2018 FINDINGS: Brain: No evidence of acute infarction, hemorrhage, hydrocephalus, extra-axial collection, visible mass lesion or mass effect. Basal cisterns are patent. Midline intracranial structures are unremarkable. Cerebellar tonsils are normally positioned. Vascular: Age advanced atherosclerotic calcification is seen of the intradural vertebral arteries. Skull: No calvarial fracture or suspicious osseous lesion. No scalp swelling or hematoma. Sinuses/Orbits: Paranasal sinuses and mastoid air cells are predominantly clear. Included orbital structures are unremarkable. Other: None IMPRESSION: 1. No acute intracranial abnormality. 2. 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 Electronically Signed   By: Kreg Shropshire M.D.   On: 07/11/2020 03:07     Radiology CT Head Wo Contrast  Result Date: 07/11/2020 CLINICAL DATA:  Worsening migraine for several hours, disease this and light sensitivity, history of bipolar disorder EXAM: CT HEAD WITHOUT CONTRAST TECHNIQUE: Contiguous axial images were obtained from the base of the skull through the vertex without intravenous contrast. COMPARISON:  CT head and cervical spine 05/06/2018 FINDINGS: Brain: No evidence of acute infarction, hemorrhage, hydrocephalus, extra-axial collection, visible mass lesion or mass effect. Basal cisterns are patent. Midline intracranial structures are unremarkable. Cerebellar tonsils are normally positioned. Vascular: Age advanced atherosclerotic calcification is seen of the intradural vertebral arteries. Skull: No calvarial fracture or suspicious osseous lesion. No scalp swelling or hematoma. Sinuses/Orbits: Paranasal sinuses and mastoid air cells are predominantly clear. Included orbital structures are unremarkable. Other: None IMPRESSION: 1. No acute intracranial abnormality. 2. 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 Electronically Signed   By: Kreg Shropshire M.D.   On: 07/11/2020 03:07    Procedures Procedures (including critical care time)  Medications Ordered in ED Medications  ketorolac (TORADOL) injection 60 mg (60 mg Intramuscular Given 07/11/20 0304)  metoCLOPramide (REGLAN) tablet 10 mg (10 mg Oral Given 07/11/20 0303)  diphenhydrAMINE (BENADRYL) 12.5 MG/5ML elixir 12.5 mg (12.5 mg Oral Given 07/11/20 0303)    ED Course  I have reviewed the triage vital signs and the nursing notes.  Pertinent labs & imaging results that were available during my care of the patient were reviewed by me and considered in my medical decision making (see chart for details).   Well appearing.  AO4, no signs of ICH on exam. No signs of meningitis no encephalitis.   Not on OCP, no proptosis, intact cognition, disk margins are sharp, doubt  dural sinus thrombosis. Neuro exam is benign and reassuring.     Amy Bradley  LENZIE MONTESANO was evaluated in Emergency Department on 07/11/2020 for the symptoms described in the history of present illness. She was evaluated in the context of the global COVID-19 pandemic, which necessitated consideration that the patient might be at risk for infection with the SARS-CoV-2 virus that causes COVID-19. Institutional protocols and algorithms that pertain to the evaluation of patients at risk for COVID-19 are in a state of rapid change based on information released by regulatory bodies including the CDC and federal and state organizations. These policies and algorithms were followed during the patient's care in the ED.  Final Clinical Impression(s) / ED Diagnoses Return for intractable cough, coughing up blood,fevers >100.4 unrelieved by medication, shortness of breath, intractable vomiting, chest pain, shortness of breath, weakness,numbness, changes in speech, facial asymmetry,abdominal pain, passing out,Inability to tolerate liquids or food, cough, altered mental status or any concerns. No signs of systemic illness or infection. The patient is nontoxic-appearing on exam and vital signs are within normal limits.   I have reviewed the triage vital signs and the nursing notes. Pertinent labs &imaging results that were available during my care of the patient were reviewed by me and considered in my medical decision making (see chart for details).After history, exam, and medical workup I feel the patient has beenappropriately medically screened and is safe for discharge home. Pertinent diagnoses were discussed with the patient. Patient was given return precautions.    Polk Minor, MD 07/11/20 [phone]

## 2021-02-28 ENCOUNTER — Encounter (HOSPITAL_COMMUNITY): Payer: Self-pay

## 2021-02-28 ENCOUNTER — Other Ambulatory Visit: Payer: Self-pay

## 2021-02-28 ENCOUNTER — Emergency Department (HOSPITAL_COMMUNITY): Payer: 59

## 2021-02-28 ENCOUNTER — Emergency Department (HOSPITAL_COMMUNITY)
Admission: EM | Admit: 2021-02-28 | Discharge: 2021-03-01 | Disposition: A | Discharge status: Home / Self Care | Payer: 59 | Attending: Emergency Medicine | Admitting: Emergency Medicine

## 2021-02-28 DIAGNOSIS — R55 Syncope and collapse: Secondary | ICD-10-CM | POA: Insufficient documentation

## 2021-02-28 DIAGNOSIS — R059 Cough, unspecified: Secondary | ICD-10-CM | POA: Insufficient documentation

## 2021-02-28 DIAGNOSIS — Z79899 Other long term (current) drug therapy: Secondary | ICD-10-CM | POA: Diagnosis not present

## 2021-02-28 DIAGNOSIS — R072 Precordial pain: Secondary | ICD-10-CM | POA: Diagnosis not present

## 2021-02-28 LAB — CBC
HCT: 40.5 % (ref 36.0–46.0)
Hemoglobin: 13.5 g/dL (ref 12.0–15.0)
MCH: 31.1 pg (ref 26.0–34.0)
MCHC: 33.3 g/dL (ref 30.0–36.0)
MCV: 93.3 fL (ref 80.0–100.0)
Platelets: 370 10*3/uL (ref 150–400)
RBC: 4.34 MIL/uL (ref 3.87–5.11)
RDW: 12.2 % (ref 11.5–15.5)
WBC: 8.6 10*3/uL (ref 4.0–10.5)
nRBC: 0 % (ref 0.0–0.2)

## 2021-02-28 LAB — BASIC METABOLIC PANEL
Anion gap: 8 (ref 5–15)
BUN: 12 mg/dL (ref 6–20)
CO2: 24 mmol/L (ref 22–32)
Calcium: 9.6 mg/dL (ref 8.9–10.3)
Chloride: 106 mmol/L (ref 98–111)
Creatinine, Ser: 0.71 mg/dL (ref 0.44–1.00)
GFR, Estimated: >60 mL/min (ref >60)
Glucose, Bld: 109 mg/dL — ABNORMAL HIGH (ref 70–99)
Potassium: 3.7 mmol/L (ref 3.5–5.1)
Sodium: 138 mmol/L (ref 135–145)

## 2021-02-28 LAB — I-STAT BETA HCG BLOOD, ED (MC, WL, AP ONLY): I-stat hCG, quantitative: <5 m[IU]/mL (ref <5)

## 2021-02-28 LAB — TROPONIN I (HIGH SENSITIVITY): Troponin I (High Sensitivity): <2 ng/L (ref <18)

## 2021-02-28 NOTE — ED Triage Notes (Signed)
Pt reports having an upper respiratory infection for about a week now. Pt reports developing chest pain and SHOB over the past few days. She states that she passed out earlier today and that it took longer than normal for her to come to than her normal POTS LOC.

## 2021-03-01 ENCOUNTER — Emergency Department (HOSPITAL_COMMUNITY): Payer: 59

## 2021-03-01 ENCOUNTER — Encounter (HOSPITAL_COMMUNITY): Payer: Self-pay

## 2021-03-01 LAB — TROPONIN I (HIGH SENSITIVITY): Troponin I (High Sensitivity): <2 ng/L (ref <18)

## 2021-03-01 MED ORDER — IOHEXOL 350 MG/ML SOLN
80.0000 mL | Freq: Once | INTRAVENOUS | Status: AC | PRN
  Administered 2021-03-01: 80 mL via INTRAVENOUS

## 2021-03-01 NOTE — ED Provider Notes (Signed)
[facility]-EMERGENCY DEPT Provider Note   CSN: [id number] Arrival date & time: 02/28/21  2104     History Chief Complaint  Patient presents with   Chest Pain   Loss of Consciousness         Amy Bradley is a 20 y.o. female.  The history is provided by the patient.  Chest Pain Pain location:  Substernal area Pain quality: dull   Pain radiates to:  Does not radiate Pain severity:  Moderate Onset quality:  Gradual Duration:  1 week Timing:  Constant Progression:  Unchanged Chronicity:  New Associated symptoms: cough and syncope   Associated symptoms: no anxiety, no back pain, no fever, no heartburn, no orthopnea, no palpitations and no vomiting   Risk factors: no birth control, not female, not pregnant and no prior DVT/PE   Loss of Consciousness Episode history:  Single Most recent episode:  Today Duration:  2 minutes Timing:  Constant Progression:  Resolved Chronicity:  Recurrent Context: not blood draw, not exertion and not sight of blood   Witnessed: no   Relieved by:  Nothing Worsened by:  Nothing Ineffective treatments:  None tried Associated symptoms: chest pain   Associated symptoms: no anxiety, no fever, no palpitations and no vomiting   Risk factors: no seizures and no vascular disease   Patient with Bipolar and POTS who presents with a week of URI and pain in chest and cough that caused her to pass out.  Negative covid tests x 2.  No OCP no leg pain no exertional symptoms.      Past Medical History:  Diagnosis Date   Anxiety    Bipolar 1 disorder (HCC)    Chronic fatigue    Depression    POTS (postural orthostatic tachycardia syndrome)     Patient Active Problem List   Diagnosis Date Noted   Dizziness 06/23/2017   Syncope 06/23/2017   Chest pain 06/23/2017    History reviewed. No pertinent surgical history.   OB History   No obstetric history on file.     Family History  Problem Relation Age of Onset   Lung  cancer Father    Heart disease Neg Hx    Sudden Cardiac Death Neg Hx     Social History   Tobacco Use   Smoking status: Never   Smokeless tobacco: Never  Vaping Use   Vaping Use: Never used  Substance Use Topics   Alcohol use: No   Drug use: No    Home Medications Prior to Admission medications   Medication Sig Start Date End Date Taking? Authorizing Provider  amphetamine-dextroamphetamine (ADDERALL) 10 MG tablet Take 10 mg by mouth daily with breakfast.    [provider]  ARIPiprazole (ABILIFY) 10 MG tablet Take 20 mg by mouth daily.     [provider]  midodrine (PROAMATINE) 5 MG tablet Take 5 mg by mouth 2 (two) times daily with a meal.    [provider]  ondansetron (ZOFRAN) 4 MG tablet Take 1 tablet (4 mg total) by mouth every 6 (six) hours. Patient not taking: Reported on 07/01/2018 04/23/18   Emi Holes, PA-C  pindolol (VISKEN) 5 MG tablet Take 2.5 mg by mouth 2 (two) times daily.     [provider]  sertraline (ZOLOFT) 100 MG tablet Take 200 mg by mouth daily.     [provider]    Allergies    Sulfa antibiotics and Penicillins  Review of Systems  Review of Systems  Constitutional:  Negative for fever.  HENT:  Negative for drooling.   Eyes:  Negative for redness.  Respiratory:  Positive for cough. Negative for wheezing and stridor.   Cardiovascular:  Positive for chest pain and syncope. Negative for palpitations, orthopnea and leg swelling.  Gastrointestinal:  Negative for heartburn and vomiting.  Genitourinary:  Negative for difficulty urinating.  Musculoskeletal:  Negative for back pain and neck stiffness.  Skin:  Negative for rash.  Neurological:  Positive for syncope. Negative for facial asymmetry.  Psychiatric/Behavioral:  Negative for agitation.   All other systems reviewed and are negative.  Physical Exam Updated Vital Signs BP 123/77 (BP Location: Right Arm)   Pulse 88   Temp 97.8 F (36.6 C)  (Oral)   Resp 18   Ht 5\' 8"  (1.727 m)   Wt 79.4 kg   LMP 02/14/2021   SpO2 100%   BMI 26.61 kg/m   Physical Exam Vitals and nursing note reviewed.  Constitutional:      General: She is not in acute distress.    Appearance: Normal appearance.  HENT:     Head: Normocephalic and atraumatic.     Nose: Nose normal.  Eyes:     Conjunctiva/sclera: Conjunctivae normal.     Pupils: Pupils are equal, round, and reactive to light.  Cardiovascular:     Rate and Rhythm: Normal rate and regular rhythm.     Pulses: Normal pulses.     Heart sounds: Normal heart sounds.  Pulmonary:     Effort: Pulmonary effort is normal.     Breath sounds: Normal breath sounds.  Abdominal:     General: Abdomen is flat. Bowel sounds are normal.     Palpations: Abdomen is soft.     Tenderness: There is no abdominal tenderness. There is no guarding.  Musculoskeletal:        General: Normal range of motion.     Cervical back: Normal range of motion and neck supple.  Skin:    General: Skin is warm and dry.     Capillary Refill: Capillary refill takes less than 2 seconds.  Neurological:     General: No focal deficit present.     Mental Status: She is alert and oriented to person, place, and time.     Deep Tendon Reflexes: Reflexes normal.  Psychiatric:        Thought Content: Thought content normal.    ED Results / Procedures / Treatments   Labs (all labs ordered are listed, but only abnormal results are displayed) Labs Reviewed  BASIC METABOLIC PANEL - Abnormal; Notable for the following components:      Result Value   Glucose, Bld 109 (*)    All other components within normal limits  CBC  I-STAT BETA HCG BLOOD, ED ([facility], [facility], [facility] ONLY)  TROPONIN I (HIGH SENSITIVITY)  TROPONIN I (HIGH SENSITIVITY)    EKG EKG Interpretation  Date/Time:  Saturday February 28 2021 21:24:50 EDT Ventricular Rate:  89 PR Interval:  128 QRS Duration: 71 QT Interval:  457 QTC Calculation: 557 R Axis:   85 Text  Interpretation: Sinus rhythm Nonspecific T abnormalities, diffuse leads Prolonged QT interval Confirmed by Eliyanna Ault ([id number]) on 02/28/2021 11:36:12 PM  Radiology DG Chest 2 View  Result Date: 02/28/2021 CLINICAL DATA:  Chest pain EXAM: CHEST - 2 VIEW COMPARISON:  July 07, 2017 FINDINGS: The heart size and mediastinal contours are within normal limits. No focal consolidation. No pleural effusion. No pneumothorax. The  visualized skeletal structures are unremarkable. IMPRESSION: No acute cardiopulmonary disease. Electronically Signed   By: Maudry Mayhew MD   On: 02/28/2021 21:58   CT Angio Chest PE W and/or Wo Contrast  Result Date: 03/01/2021 CLINICAL DATA:  Upper respiratory infection for 1 week, developing chest pain and shortness of breath EXAM: CT ANGIOGRAPHY CHEST WITH CONTRAST TECHNIQUE: Multidetector CT imaging of the chest was performed using the standard protocol during bolus administration of intravenous contrast. Multiplanar CT image reconstructions and MIPs were obtained to evaluate the vascular anatomy. CONTRAST:  75mL OMNIPAQUE IOHEXOL 350 MG/ML SOLN COMPARISON:  Radiograph 02/28/2021 FINDINGS: Cardiovascular: Satisfactory opacification the pulmonary arteries however evaluation is limited by extensive respiratory motion artifact which may limit detection of smaller segmental and subsegmental filling defects. No central or lobar filling defects are identified. Central pulmonary arteries are normal caliber. The aortic root is suboptimally assessed given cardiac pulsation artifact. Suboptimal opacification of the thoracic aorta. No gross acute thoracic aortic abnormality. Normal caliber aorta. Normal branching of the otherwise unremarkable great vessels. No major venous abnormality or significant venous reflux. Mediastinum/Nodes: No mediastinal fluid or gas. Normal thyroid gland and thoracic inlet. No acute abnormality of the trachea or esophagus. No worrisome mediastinal, hilar or axillary  adenopathy. Lungs/Pleura: Airways are patent. No consolidation, features of edema, pneumothorax, or effusion. No suspicious pulmonary nodules or masses. Upper Abdomen: No acute abnormalities present in the visualized portions of the upper abdomen. Musculoskeletal: No acute osseous abnormality or suspicious osseous lesion. Review of the MIP images confirms the above findings. IMPRESSION: 1. No evidence of pulmonary artery embolism to the lobar level with more distal evaluation limited by respiratory motion artifact. 2. No other acute intrathoracic process. Electronically Signed   By: Kreg Shropshire M.D.   On: 03/01/2021 01:53    Procedures Procedures   Medications Ordered in ED Medications  iohexol (OMNIPAQUE) 350 MG/ML injection 80 mL (80 mLs Intravenous Contrast Given 03/01/21 0125)    ED Course  I have reviewed the triage vital signs and the nursing notes.  Pertinent labs & imaging results that were available during my care of the patient were reviewed by me and considered in my medical decision making (see chart for details).   Suspect coughing spell as cause of syncope.  Ruled out for MI and PE in the ED.  Symptomatic management of cough.  Follow up with your PMD for ongoing care.    OCEANE FOSSE was evaluated in Emergency Department on 03/01/2021 for the symptoms described in the history of present illness. She was evaluated in the context of the global COVID-19 pandemic, which necessitated consideration that the patient might be at risk for infection with the SARS-CoV-2 virus that causes COVID-19. Institutional protocols and algorithms that pertain to the evaluation of patients at risk for COVID-19 are in a state of rapid change based on information released by regulatory bodies including the CDC and federal and state organizations. These policies and algorithms were followed during the patient's care in the ED.    Final Clinical Impression(s) / ED Diagnoses Final diagnoses:  Syncope,  unspecified syncope type    Return for intractable cough, coughing up blood, fevers > 100.4 unrelieved by medication, shortness of breath, intractable vomiting, chest pain, shortness of breath, weakness, numbness, changes in speech, facial asymmetry, abdominal pain, passing out, Inability to tolerate liquids or food, cough, altered mental status or any concerns. No signs of systemic illness or infection. The patient is nontoxic-appearing on exam and vital signs are within  normal limits. I have reviewed the triage vital signs and the nursing notes. Pertinent labs & imaging results that were available during my care of the patient were reviewed by me and considered in my medical decision making (see chart for details). After history, exam, and medical workup I feel the patient has been appropriately medically screened and is safe for discharge home. Pertinent diagnoses were discussed with the patient. Patient was given return precautions.  Rx / DC Orders ED Discharge Orders     None        Kamonte Mcmichen, MD 03/01/21 5701

## 2021-03-02 ENCOUNTER — Encounter: Payer: Self-pay | Admitting: *Deleted

## 2021-03-03 ENCOUNTER — Other Ambulatory Visit: Payer: Self-pay

## 2021-03-03 ENCOUNTER — Encounter: Payer: Self-pay | Admitting: Neurology

## 2021-03-03 ENCOUNTER — Ambulatory Visit: Payer: 59 | Admitting: Neurology

## 2021-03-03 VITALS — BP 100/69 | HR 84 | Ht 68.0 in | Wt 173.2 lb

## 2021-03-03 DIAGNOSIS — G43109 Migraine with aura, not intractable, without status migrainosus: Secondary | ICD-10-CM | POA: Insufficient documentation

## 2021-03-03 MED ORDER — RIZATRIPTAN BENZOATE 10 MG PO TBDP: 10.0000 mg | ORAL_TABLET | ORAL | 11 refills | Status: AC | PRN

## 2021-03-03 MED ORDER — ONDANSETRON HCL 4 MG PO TABS: 4.0000 mg | ORAL_TABLET | Freq: Four times a day (QID) | ORAL | 6 refills | Status: AC

## 2021-03-03 MED ORDER — ONDANSETRON HCL 4 MG PO TABS: 4.0000 mg | ORAL_TABLET | Freq: Four times a day (QID) | ORAL | 6 refills | Status: DC

## 2021-03-03 NOTE — Progress Notes (Addendum)
[facility]    Provider:  Dr Lucia Gaskins Requesting Provider: Dois Davenport, MD Primary Care Provider:  Patient, No Pcp Per (Inactive)  CC:  migraines  HPI:  Amy Bradley is a 20 y.o. female here as requested by Dois Davenport, MD for here as requested by Georgianne Fick, MD for headache.  Past medical history orthostatic hypotension, bipolar disorder, schizoaffective disorder, other fatigue, chronic migraine without aura, dehydration, nausea and vomiting, somnolence, unspecified visual loss, POTS.  Patient presents today with migraines, onset 2 years ago, off and on, "brainstem migraines", patient states she gets disoriented with her migraines.  Startes with pain between the eyes, she has neurological symptoms with the migraines, clumsy, sluring words, disoriented and confused, stabbing/pulsating/pounds, radiates to behind both eyes, lots of pressure, phono>photophobia, nausea, vomits, movement makes makes it worse, sitting in a dark room makes it better, can be severe so painful can black out. She noticed worsening when she decreased her Depakote now taking 500 mg, (I did mention depakote has significant teratogenicity associated, she is aware), patient's GP had prescribed an injection but she is unable to recall which 1, she had to stop due to an allergic reaction, she takes occasional Tylenol with no major relief, she has syncopal episodes due to her POTS.She hasnot had a migraine in a few months but usually 2 a month. She tried tylenol, Emgality gave her a rash. Lasted up to 12 hours. Havent had one in a few months, no new symptoms over the last few years, not exertional or positional.   Reviewed notes, labs and imaging from outside physicians, which showed:  FINDINGS: Brain: No evidence of acute infarction, hemorrhage, hydrocephalus, extra-axial collection, visible mass lesion or mass effect. Basal cisterns are patent. Midline intracranial structures  are unremarkable. Cerebellar tonsils are normally positioned.   Vascular: Age advanced atherosclerotic calcification is seen of the intradural vertebral arteries.   Skull: No calvarial fracture or suspicious osseous lesion. No scalp swelling or hematoma.   Sinuses/Orbits: Paranasal sinuses and mastoid air cells are predominantly clear. Included orbital structures are unremarkable.   Other: None   CT head 07/11/2020: personally reviewed and agree with findings 1. No acute intracranial abnormality. 2. 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   From a thorough review of records, medications tried that can be used in headache/migraine management include: Depakote, Lexapro, Zofran, pindolol, Abilify and Geodon, Prozac, promethazine,benadryl, toradol, meclizine, reglan, naproxen, zoloft, amitriptyline/nortriptyline, emgality with a rash in the area of the injection but no anaphylaxis, Bernita Raisin has not tried nurtec  I reviewed Dr. Windell Moulding and's notes, his physical exam was normal including head, eyes, neck, chest, heart, abdomen, neuro, he has POTS with some dysautonomia.  Patient states he has been throwing up a lot, she will get nauseated and bloated, still having migraines, the "injection" left a big red lump that was itchy for a few days, she has been having double vision, slurred speech and mental fog with her migraines, patient states that it sounds like a "brainstem stroke or a migraine".  MRI 2018 [facility], reviewed report: DATE OF EXAM: Aug 10 2017 11:27AM     BCM   0294  -  MRI BRAIN Isabella Stalling  / ACCESSION #  [id number]   PROCEDURE REASON: multiple diagnoses        * * * * Physician Interpretation * * * *    EXAMINATION:  MRI BRAIN WO IVCON   CLINICAL HISTORY:  Chiari I malformation  TECHNIQUE:  Routine noncontrast MRI protocol including diffusion images.    Cine MR imaging was also performed  MQ: [id number]   COMPARISON:  None.    RESULT:   Acute Change:   There is no evidence of restricted diffusion to suggest  an acute infarct.   Hemorrhage:    No evidence of prior parenchymal hemorrhage on the  gradient echo images.   Mass Lesion/ Mass Effect:    No evidence of an intracranial mass or  extra-axial fluid collection.  No significant mass effect.   Chronic Change:  The white matter is within normal limits of signal  intensity for age.   Parenchyma:   No significant volume loss for age.  The brain parenchyma  is otherwise within normal limits of signal intensity and morphology.   Ventricles:     Normal caliber and morphology.   Skull Base:    Hypothalamic and pituitary region are grossly normal.    Craniocervical junction is normal. No significant marrow replacement  process.   Vasculature:    Major intracranial arterial structures, and dural venous  sinuses show typical flow void, suggesting patency by spin echo criteria.   Other:  Mucous retention cyst or polyp is noted involving the left  maxillary sinus and left sphenoid sinus.  The orbits and extracranial  soft tissues are unremarkable.  Cine MR imaging demonstrates biphasic  flow within the cerebral aqueduct, prepontine cistern, fourth ventricle,  and anterior and posterior to the cervicomedullary junction and proximal  cervical cord.  Procedure Note  Provider, [facility] - 08/10/2017  Formatting of this note might be different from the original.  * * *Final Report* * *   DATE OF EXAM: Aug 10 2017 11:27AM     BCM   0294  -  MRI BRAIN WO IVCON  / ACCESSION #  [id number]   PROCEDURE REASON: multiple diagnoses   * * * * Physician Interpretation * * * *    EXAMINATION:  MRI BRAIN WO IVCON   CLINICAL HISTORY:  Chiari I malformation   TECHNIQUE:  Routine noncontrast MRI protocol including diffusion images.    Cine MR imaging was also performed  MQ: [id number]   COMPARISON: None.    RESULT:   Acute Change:   There is  no evidence of restricted diffusion to suggest  an acute infarct.   Hemorrhage:    No evidence of prior parenchymal hemorrhage on the  gradient echo images.   Mass Lesion/ Mass Effect:    No evidence of an intracranial mass or  extra-axial fluid collection.  No significant mass effect.   Chronic Change:  The white matter is within normal limits of signal  intensity for age.   Parenchyma:   No significant volume loss for age.  The brain parenchyma  is otherwise within normal limits of signal intensity and morphology.   Ventricles:     Normal caliber and morphology.   Skull Base:    Hypothalamic and pituitary region are grossly normal.    Craniocervical junction is normal. No significant marrow replacement  process.   Vasculature:    Major intracranial arterial structures, and dural venous  sinuses show typical flow void, suggesting patency by spin echo criteria.   Other:  Mucous retention cyst or polyp is noted involving the left  maxillary sinus and left sphenoid sinus.  The orbits and extracranial  soft tissues are unremarkable.  Cine MR imaging demonstrates biphasic  flow within the cerebral aqueduct, prepontine cistern, fourth  ventricle,  and anterior and posterior to the cervicomedullary junction and proximal  cervical cord.   IMPRESSION  IMPRESSION:   No acute intracranial process   Reviewed notes, labs and imaging from outside physicians, which showed:  Review of Systems: Patient complains of symptoms per HPI as well as the following symptoms pots. Pertinent negatives and positives per HPI. All others negative.   Social History   Socioeconomic History   Marital status: Single    Spouse name: Not on file   Number of children: Not on file   Years of education: Not on file   Highest education level: Some college, no degree  Occupational History   Not on file  Tobacco Use   Smoking status: Never   Smokeless tobacco: Never  Vaping Use   Vaping Use: Never used   Substance and Sexual Activity   Alcohol use: No   Drug use: No   Sexual activity: Never    Birth control/protection: None  Other Topics Concern   Not on file  Social History Narrative   Lives with roommates   Right handed   Caffeine: none   Social Determinants of Health   Financial Resource Strain: Not on file  Food Insecurity: Not on file  Transportation Needs: Not on file  Physical Activity: Not on file  Stress: Not on file  Social Connections: Not on file  Intimate Partner Violence: Not on file    Family History  Problem Relation Age of Onset   Depression Mother    Lung cancer Father    Depression Father    Bipolar disorder Brother    Anxiety disorder Maternal Grandfather    Depression Maternal Grandfather    Heart disease Neg Hx    Sudden Cardiac Death Neg Hx     Past Medical History:  Diagnosis Date   Anxiety    Bipolar 1 disorder (HCC)    Chronic fatigue    Depression    POTS (postural orthostatic tachycardia syndrome)     Patient Active Problem List   Diagnosis Date Noted   Migraine with aura and without status migrainosus, not intractable 03/03/2021   Dizziness 06/23/2017   Syncope 06/23/2017   Chest pain 06/23/2017    History reviewed. No pertinent surgical history.  Current Outpatient Medications  Medication Sig Dispense Refill   amphetamine-dextroamphetamine (ADDERALL) 10 MG tablet Take 10 mg by mouth daily with breakfast.     ARIPiprazole (ABILIFY) 10 MG tablet Take 20 mg by mouth daily.      divalproex (DEPAKOTE ER) 500 MG 24 hr tablet Take 1 tablet by mouth daily.     escitalopram (LEXAPRO) 10 MG tablet Take 20 mg by mouth daily.     fludrocortisone (FLORINEF) 0.1 MG tablet Take 100 mcg by mouth daily.     midodrine (PROAMATINE) 5 MG tablet Take 5 mg by mouth 2 (two) times daily with a meal.     pantoprazole (PROTONIX) 40 MG tablet Take 40 mg by mouth daily.     pindolol (VISKEN) 5 MG tablet Take 2.5 mg by mouth 2 (two) times daily.       rizatriptan (MAXALT-MLT) 10 MG disintegrating tablet Take 1 tablet (10 mg total) by mouth as needed for migraine. May repeat in 2 hours if needed 9 tablet 11   sertraline (ZOLOFT) 100 MG tablet Take 200 mg by mouth daily.      traZODone (DESYREL) 50 MG tablet Take 25 mg by mouth at bedtime as needed.     ondansetron (  ZOFRAN) 4 MG tablet Take 1-2 tablets (4-8 mg total) by mouth every 6 (six) hours. 20 tablet 6   No current facility-administered medications for this visit.    Allergies as of 03/03/2021 - Review Complete 03/03/2021  Allergen Reaction Noted   Amoxicillin  03/02/2021   Eggs or egg-derived products  03/02/2021   Sulfa antibiotics  06/19/2017   Penicillins Rash 06/19/2017    Vitals: BP 100/69   Pulse 84   Ht 5\' 8"  (1.727 m)   Wt 173 lb 3.2 oz (78.6 kg)   LMP 02/14/2021   BMI 26.33 kg/m  Last Weight:  Wt Readings from Last 1 Encounters:  03/03/21 173 lb 3.2 oz (78.6 kg)   Last Height:   Ht Readings from Last 1 Encounters:  03/03/21 5\' 8"  (1.727 m)     Physical exam: Exam: Gen: NAD, conversant, well nourised, well groomed                     CV: RRR, no MRG. No Carotid Bruits. No peripheral edema, warm, nontender Eyes: Conjunctivae clear without exudates or hemorrhage  Neuro: Detailed Neurologic Exam  Speech:    Speech is normal; fluent and spontaneous with normal comprehension.  Cognition:    The patient is oriented to person, place, and time;     recent and remote memory intact;     language fluent;     normal attention, concentration,     fund of knowledge Cranial Nerves:    The pupils are equal, round, and reactive to light. The fundi are normal and spontaneous venous pulsations are present. Visual fields are full to finger confrontation. Extraocular movements are intact. Trigeminal sensation is intact and the muscles of mastication are normal. The face is symmetric. The palate elevates in the midline. Hearing intact. Voice is normal. Shoulder shrug is  normal. The tongue has normal motion without fasciculations.   Coordination:    Normal finger to nose    Gait:    Heel-toe and tandem gait are normal.   Motor Observation:    No asymmetry, no atrophy, and no involuntary movements noted. Tone:    Normal muscle tone.    Posture:    Posture is normal. normal erect    Strength:    Strength is V/V in the upper and lower limbs.      Sensation: intact to LT     Reflex Exam:  DTR's:    Deep tendon reflexes in the upper and lower extremities are normal bilaterally.   Toes:    The toes are downgoing bilaterally.   Clonus:    Clonus is absent.    Assessment/Plan:  Patient with migraine with aura, episodic, hasn;t had one in a few months and may have max 2x/month. From a thorough review of records, medications tried that can be used in headache/migraine management include: Depakote, Lexapro, Zofran, pindolol, Abilify and Geodon, Prozac, promethazine,benadryl, toradol, meclizine, reglan, naproxen, zoloft, amitriptyline/nortriptyline, emgality with a rash in the area of the injection but no anaphylaxis, 05/04/21 did not work but has not tried nurtec  In the past triptans were thought to be contraindicated in migraines with "brain stem aura". But there is lots of evidence that safe to take triptans and  discussed with patient the risks(stroke, cvrs) vs benefits. She understands and is open to trying triptan class.  No imaging indicated at this time.  Discussed risk of stroke in migraine with aura  Rizatriptan: Please take one tablet at the onset of  your headache. If it does not improve the symptoms please take one additional tablet. Do not take more then 2 tablets in 24hrs. Do not take use more then 2 to 3 times in a week.Can take with ondansetron, tylenol, advil, excedrin.  Ondansetron: Can take for nausea and can take with Rizatriptan.  No orders of the defined types were placed in this encounter.  Meds ordered this encounter   Medications   rizatriptan (MAXALT-MLT) 10 MG disintegrating tablet    Sig: Take 1 tablet (10 mg total) by mouth as needed for migraine. May repeat in 2 hours if needed    Dispense:  9 tablet    Refill:  11   DISCONTD: ondansetron (ZOFRAN) 4 MG tablet    Sig: Take 1-2 tablets (4-8 mg total) by mouth every 6 (six) hours.    Dispense:  20 tablet    Refill:  6   ondansetron (ZOFRAN) 4 MG tablet    Sig: Take 1-2 tablets (4-8 mg total) by mouth every 6 (six) hours.    Dispense:  20 tablet    Refill:  6    Cc: Dois Davenport, MD,  Patient, No Pcp Per (Inactive)  Naomie Dean, MD  [facility] [address] [place], [place] [address]  Phone [phone] Fax [phone]

## 2021-03-03 NOTE — Patient Instructions (Addendum)
Rizatriptan: Please take one tablet at the onset of your headache. If it does not improve the symptoms please take one additional tablet. Do not take more then 2 tablets in 24hrs. Do not take use more then 2 to 3 times in a week.Can take with ondansetron, tylenol, advil, excedrin.  Ondansetron: Can take for nausea and can take with Rizatriptan.  There is increased risk for stroke in women with migraine with aura and a contraindication for the combined contraceptive pill for use by women who have migraine with aura. The risk for women with migraine without aura is lower. However other risk factors like smoking are far more likely to increase stroke risk than migraine. There is a recommendation for no smoking and for the use of OCPs without estrogen such as progestogen only pills particularly for women with migraine with aura.Marland Kitchen People who have migraine headaches with auras may be 3 times more likely to have a stroke caused by a blood clot, compared to migraine patients who don't see auras. Women who take hormone-replacement therapy may be 30 percent more likely to suffer a clot-based stroke than women not taking medication containing estrogen. Other risk factors like smoking and high blood pressure may be  much more important.  Migraine Headache A migraine headache is an intense, throbbing pain on one side or both sides of the head. Migraine headaches may also cause other symptoms, such as nausea, vomiting, and sensitivity to light and noise. A migraine headache can last from 4 hours to 3 days. Talk with your doctor about what things may bring on (trigger) your migraine headaches. What are the causes? The exact cause of this condition is not known. However, a migraine may be caused when nerves in the brain become irritated and release chemicals that cause inflammation of blood vessels. This inflammation causes pain. This condition may be triggered or caused by: Drinking alcohol. Smoking. Taking medicines,  such as: Medicine used to treat chest pain (nitroglycerin). Birth control pills. Estrogen. Certain blood pressure medicines. Eating or drinking products that contain nitrates, glutamate, aspartame, or tyramine. Aged cheeses, chocolate, or caffeine may also be triggers. Doing physical activity. Other things that may trigger a migraine headache include: Menstruation. Pregnancy. Hunger. Stress. Lack of sleep or too much sleep. Weather changes. Fatigue. What increases the risk? The following factors may make you more likely to experience migraine headaches: Being a certain age. This condition is more common in people who are 29-53 years old. Being female. Having a family history of migraine headaches. Being Caucasian. Having a mental health condition, such as depression or anxiety. Being obese. What are the signs or symptoms? The main symptom of this condition is pulsating or throbbing pain. This pain may: Happen in any area of the head, such as on one side or both sides. Interfere with daily activities. Get worse with physical activity. Get worse with exposure to bright lights or loud noises. Other symptoms may include: Nausea. Vomiting. Dizziness. General sensitivity to bright lights, loud noises, or smells. Before you get a migraine headache, you may get warning signs (an aura). An aura may include: Seeing flashing lights or having blind spots. Seeing bright spots, halos, or zigzag lines. Having tunnel vision or blurred vision. Having numbness or a tingling feeling. Having trouble talking. Having muscle weakness. Some people have symptoms after a migraine headache (postdromal phase), such as: Feeling tired. Difficulty concentrating. How is this diagnosed? A migraine headache can be diagnosed based on: Your symptoms. A physical exam. Tests, such  as: CT scan or an MRI of the head. These imaging tests can help rule out other causes of headaches. Taking fluid from the  spine (lumbar puncture) and analyzing it (cerebrospinal fluid analysis, or CSF analysis). How is this treated? This condition may be treated with medicines that: Relieve pain. Relieve nausea. Prevent migraine headaches. Treatment for this condition may also include: Acupuncture. Lifestyle changes like avoiding foods that trigger migraine headaches. Biofeedback. Cognitive behavioral therapy. Follow these instructions at home: Medicines Take over-the-counter and prescription medicines only as told by your health care provider. Ask your health care provider if the medicine prescribed to you: Requires you to avoid driving or using heavy machinery. Can cause constipation. You may need to take these actions to prevent or treat constipation: Drink enough fluid to keep your urine pale yellow. Take over-the-counter or prescription medicines. Eat foods that are high in fiber, such as beans, whole grains, and fresh fruits and vegetables. Limit foods that are high in fat and processed sugars, such as fried or sweet foods. Lifestyle Do not drink alcohol. Do not use any products that contain nicotine or tobacco, such as cigarettes, e-cigarettes, and chewing tobacco. If you need help quitting, ask your health care provider. Get at least 8 hours of sleep every night. Find ways to manage stress, such as meditation, deep breathing, or yoga. General instructions     Keep a journal to find out what may trigger your migraine headaches. For example, write down: What you eat and drink. How much sleep you get. Any change to your diet or medicines. If you have a migraine headache: Avoid things that make your symptoms worse, such as bright lights. It may help to lie down in a dark, quiet room. Do not drive or use heavy machinery. Ask your health care provider what activities are safe for you while you are experiencing symptoms. Keep all follow-up visits as told by your health care provider. This is  important. Contact a health care provider if: You develop symptoms that are different or more severe than your usual migraine headache symptoms. You have more than 15 headache days in one month. Get help right away if: Your migraine headache becomes severe. Your migraine headache lasts longer than 72 hours. You have a fever. You have a stiff neck. You have vision loss. Your muscles feel weak or like you cannot control them. You start to lose your balance often. You have trouble walking. You faint. You have a seizure. Summary A migraine headache is an intense, throbbing pain on one side or both sides of the head. Migraines may also cause other symptoms, such as nausea, vomiting, and sensitivity to light and noise. This condition may be treated with medicines and lifestyle changes. You may also need to avoid certain things that trigger a migraine headache. Keep a journal to find out what may trigger your migraine headaches. Contact your health care provider if you have more than 15 headache days in a month or you develop symptoms that are different or more severe than your usual migraine headache symptoms. This information is not intended to replace advice given to you by your health care provider. Make sure you discuss any questions you have with your healthcare provider. Document Revised: 12/01/2018 Document Reviewed: 09/21/2018 Elsevier Patient Education  2022 [employer].

## 2021-03-03 NOTE — Addendum Note (Signed)
Addended by: Naomie Dean B on: 03/03/2021 10:36 AM   Modules accepted: Orders

## 2021-03-25 NOTE — Telephone Encounter (Signed)
Subjective: Caller states "I just got a PICC yesterday"     Current Symptoms: None    Onset: 5 minutes ago     Associated Symptoms: NA    Pain Severity: 0/10; N/A; none    Temperature: No fever or chills     What has been tried: Nothing    Recommended disposition: See HCP within 4 Hours (or PCP triage)    Care advice provided, patient verbalizes understanding; denies any other questions or concerns; instructed to call back for any new or worsening symptoms.    Patient/caller agrees to follow-up with PCP     This triage is a result of a call to [facility]. Please do not respond to the triage nurse through this encounter. Any subsequent communication should be directly with the patient.    Reason for Disposition   [1] Dressing needs to be changed (e.g., wet, soiled, loose, or open to air) AND [2] unable or unwilling to perform dressing change    Protocols used: IV Site and Other Symptoms-ADULT-AH

## 2021-05-03 NOTE — Telephone Encounter (Signed)
Additional Information  ??? Commented on: [1] Arm or leg swelling AND [2] has a central or PICC line  (Exception: Mild puffiness or swelling just around IV site.)     Mild swelling    Protocols used: IV Site and Other Symptoms-ADULT-AH

## 2021-05-03 NOTE — Telephone Encounter (Addendum)
Subjective: Caller states "I'm having an issue with my picc line. The area above it is swollen and painful. No redness. Feels hard and firm and hurts. Using for IVF for POTS. Receiving once a week and I got them yesterday. Nurse comes out once a week for dressing change, was out Tuesday. I have been having an allergic reaction to dressing (itchy, hives, blisters). About an inch or 2 is swollen under dressing and outside dressing. Starts at insertion site. No drainage."     Current Symptoms: slight tenderness, small area feels hard and swollen    Onset: a few hours ago; unchanged    Associated Symptoms: NA    Pain Severity: 2/10; tender; constant, soreness    Temperature: doesn't feel feverish by unknown method    What has been tried: nothing    LMP: NA Pregnant: NA    Recommended disposition: call pcp w/I 24 hours.     Care advice provided, patient verbalizes understanding; denies any other questions or concerns; instructed to call back for any new or worsening symptoms.    Patient/caller agrees to follow-up with PCP Pt was advised to call back if swelling worsens, sees any redness or pain increases, any coldness in hand/arm, or any signs of bruising appear.     This triage is a result of a call to [facility]. Please do not respond to the triage nurse through this encounter. Any subsequent communication should be directly with the patient.        Reason for Disposition   [1] Small area of LOCALIZED swelling AND [2] not better after 3 days    Additional Information   Commented on: [1] Arm or leg swelling AND [2] has a central or PICC line  (Exception: Mild puffiness or swelling just around IV site.)     Mild swelling    Protocols used: Arm Swelling and Edema-ADULT-AH, IV Site and Other Symptoms-ADULT-AH

## 2021-07-02 ENCOUNTER — Emergency Department (HOSPITAL_COMMUNITY)
Admission: EM | Admit: 2021-07-02 | Discharge: 2021-07-03 | Disposition: A | Discharge status: Home / Self Care | Payer: 59 | Attending: Emergency Medicine | Admitting: Emergency Medicine

## 2021-07-02 ENCOUNTER — Emergency Department (HOSPITAL_COMMUNITY): Payer: 59

## 2021-07-02 ENCOUNTER — Encounter (HOSPITAL_COMMUNITY): Payer: Self-pay

## 2021-07-02 ENCOUNTER — Other Ambulatory Visit: Payer: Self-pay

## 2021-07-02 DIAGNOSIS — E860 Dehydration: Secondary | ICD-10-CM | POA: Insufficient documentation

## 2021-07-02 DIAGNOSIS — R000 Tachycardia, unspecified: Secondary | ICD-10-CM | POA: Insufficient documentation

## 2021-07-02 DIAGNOSIS — Z79899 Other long term (current) drug therapy: Secondary | ICD-10-CM | POA: Diagnosis not present

## 2021-07-02 DIAGNOSIS — R5383 Other fatigue: Secondary | ICD-10-CM | POA: Insufficient documentation

## 2021-07-02 DIAGNOSIS — R0602 Shortness of breath: Secondary | ICD-10-CM | POA: Diagnosis present

## 2021-07-02 DIAGNOSIS — L509 Urticaria, unspecified: Secondary | ICD-10-CM | POA: Diagnosis not present

## 2021-07-02 DIAGNOSIS — R112 Nausea with vomiting, unspecified: Secondary | ICD-10-CM | POA: Insufficient documentation

## 2021-07-02 DIAGNOSIS — R110 Nausea: Secondary | ICD-10-CM

## 2021-07-02 NOTE — ED Provider Notes (Signed)
Emergency Medicine Provider Triage Evaluation Note  Amy Bradley , a 20 y.o. female  was evaluated in triage.  Patient states that she has a history of.  She says that earlier today she developed hives all of her body.  She states she has some shortness of breath and chest tightness along with this.  She does not know if she ingested anything abnormal.  She thinks it may been the room that she was in and it is environmental.    Review of Systems  Positive: Rash, shortness of breath, chest tightness Negative:   Physical Exam  BP 119/79 (BP Location: Left Arm)   Pulse (!) 110   Temp 98.3 F (36.8 C) (Oral)   Resp (!) 22   Ht 5\' 8"  (1.727 m)   Wt 72.6 kg   SpO2 100%   BMI 24.33 kg/m  Gen:   Awake, seems very anxious Resp:  Normal effort  MSK:   Moves extremities without difficulty  Other:  No visible rash or hives on scan.  Medical Decision Making  Medically screening exam initiated at 10:55 PM.  Appropriate orders placed.  Amy Bradley was informed that the remainder of the evaluation will be completed by another provider, this initial triage assessment does not replace that evaluation, and the importance of remaining in the ED until their evaluation is complete.     Jan Fireman 07/02/21 2257    13/10/22, MD 07/02/21 [phone]

## 2021-07-02 NOTE — ED Triage Notes (Signed)
Pt states that she is having an allergic reaction with chest pain and shortness of breath. No hives or redness noted.

## 2021-07-03 LAB — CBC WITH DIFFERENTIAL/PLATELET
Abs Immature Granulocytes: 0.06 10*3/uL (ref 0.00–0.07)
Basophils Absolute: 0.1 10*3/uL (ref 0.0–0.1)
Basophils Relative: 1 %
Eosinophils Absolute: 0.2 10*3/uL (ref 0.0–0.5)
Eosinophils Relative: 3 %
HCT: 39.8 % (ref 36.0–46.0)
Hemoglobin: 13.6 g/dL (ref 12.0–15.0)
Immature Granulocytes: 1 %
Lymphocytes Relative: 21 %
Lymphs Abs: 1.6 10*3/uL (ref 0.7–4.0)
MCH: 31.1 pg (ref 26.0–34.0)
MCHC: 34.2 g/dL (ref 30.0–36.0)
MCV: 90.9 fL (ref 80.0–100.0)
Monocytes Absolute: 0.5 10*3/uL (ref 0.1–1.0)
Monocytes Relative: 7 %
Neutro Abs: 5 10*3/uL (ref 1.7–7.7)
Neutrophils Relative %: 67 %
Platelets: 312 10*3/uL (ref 150–400)
RBC: 4.38 MIL/uL (ref 3.87–5.11)
RDW: 12.1 % (ref 11.5–15.5)
WBC: 7.4 10*3/uL (ref 4.0–10.5)
nRBC: 0 % (ref 0.0–0.2)

## 2021-07-03 LAB — BASIC METABOLIC PANEL
Anion gap: 8 (ref 5–15)
BUN: 15 mg/dL (ref 6–20)
CO2: 22 mmol/L (ref 22–32)
Calcium: 9.1 mg/dL (ref 8.9–10.3)
Chloride: 108 mmol/L (ref 98–111)
Creatinine, Ser: 0.69 mg/dL (ref 0.44–1.00)
GFR, Estimated: >60 mL/min (ref >60)
Glucose, Bld: 104 mg/dL — ABNORMAL HIGH (ref 70–99)
Potassium: 3.4 mmol/L — ABNORMAL LOW (ref 3.5–5.1)
Sodium: 138 mmol/L (ref 135–145)

## 2021-07-03 LAB — TROPONIN I (HIGH SENSITIVITY): Troponin I (High Sensitivity): <2 ng/L (ref <18)

## 2021-07-03 MED ORDER — SODIUM CHLORIDE 0.9 % IV BOLUS
500.0000 mL | Freq: Once | INTRAVENOUS | Status: AC
  Administered 2021-07-03: 500 mL via INTRAVENOUS

## 2021-07-03 MED ORDER — ONDANSETRON 4 MG PO TBDP: 4.0000 mg | ORAL_TABLET | Freq: Three times a day (TID) | ORAL | 0 refills | Status: AC | PRN

## 2021-07-03 NOTE — ED Notes (Signed)
Complaining of a little shortness of breath today, has been vomiting, thinks she is reacting to something she ate a couple of days ago

## 2021-07-03 NOTE — Discharge Instructions (Addendum)
Your work-up today was quite reassuring.  Please follow-up with your primary care team and specialists.  Continue to hydrate.  Use Zofran every 8 hours/3 times daily for the next 24 hours and then you may use it only as needed up to every 8 hours for nausea.  This tablet will dissolve under your tongue.  Drink plenty of water and Pedialyte.  You may always return to the emergency room should you experience any new or concerning symptoms.

## 2021-07-03 NOTE — ED Provider Notes (Signed)
[facility]-EMERGENCY DEPT Provider Note   CSN: [id number] Arrival date & time: 07/02/21  2241     History Chief Complaint  Patient presents with   Chest Pain   Shortness of Breath    Amy Bradley is a 20 y.o. female.  HPI     Past Medical History:  Diagnosis Date   Anxiety    Bipolar 1 disorder (HCC)    Chronic fatigue    Depression    POTS (postural orthostatic tachycardia syndrome)     Patient Active Problem List   Diagnosis Date Noted   Migraine with aura and without status migrainosus, not intractable 03/03/2021   Dizziness 06/23/2017   Syncope 06/23/2017   Chest pain 06/23/2017    History reviewed. No pertinent surgical history.   OB History   No obstetric history on file.     Family History  Problem Relation Age of Onset   Depression Mother    Lung cancer Father    Depression Father    Bipolar disorder Brother    Anxiety disorder Maternal Grandfather    Depression Maternal Grandfather    Heart disease Neg Hx    Sudden Cardiac Death Neg Hx     Social History   Tobacco Use   Smoking status: Never   Smokeless tobacco: Never  Vaping Use   Vaping Use: Never used  Substance Use Topics   Alcohol use: No   Drug use: No    Home Medications Prior to Admission medications   Medication Sig Start Date End Date Taking? Authorizing Provider  divalproex (DEPAKOTE ER) 500 MG 24 hr tablet Take 1 tablet by mouth at bedtime. 08/31/19  Yes [provider]  escitalopram (LEXAPRO) 10 MG tablet Take 20 mg by mouth daily. 02/24/21  Yes [provider]  fludrocortisone (FLORINEF) 0.1 MG tablet Take 0.05 mg by mouth daily. 02/12/21  Yes [provider]  LORazepam (ATIVAN) 0.5 MG tablet Take 0.5 mg by mouth at bedtime as needed for anxiety or sleep. 06/04/21  Yes [provider]  ondansetron (ZOFRAN ODT) 4 MG disintegrating tablet Take 1 tablet (4 mg total) by mouth every 8 (eight) hours as needed for  nausea or vomiting. 07/03/21  Yes Harlow Carrizales S, PA  ondansetron (ZOFRAN) 4 MG tablet Take 1-2 tablets (4-8 mg total) by mouth every 6 (six) hours. 03/03/21  Yes Anson Fret, MD  pantoprazole (PROTONIX) 40 MG tablet Take 40 mg by mouth daily. 12/11/20  Yes [provider]  pindolol (VISKEN) 5 MG tablet Take 2.5 mg by mouth 2 (two) times daily.    Yes [provider]  traZODone (DESYREL) 50 MG tablet Take 25 mg by mouth at bedtime as needed for sleep. 01/20/21  Yes [provider]  rizatriptan (MAXALT-MLT) 10 MG disintegrating tablet Take 1 tablet (10 mg total) by mouth as needed for migraine. May repeat in 2 hours if needed Patient not taking: Reported on 07/03/2021 03/03/21   Anson Fret, MD    Allergies    Amoxicillin, Eggs or egg-derived products, Penicillins, and Sulfa antibiotics  Review of Systems   Review of Systems  Constitutional:  Positive for fatigue. Negative for chills and fever.  HENT:  Negative for congestion.   Eyes:  Negative for pain.  Respiratory:  Negative for cough and shortness of breath.   Cardiovascular:  Positive for chest pain. Negative for leg swelling.  Gastrointestinal:  Positive for nausea and vomiting. Negative for abdominal pain and diarrhea.  Genitourinary:  Negative for dysuria.  Musculoskeletal:  Negative for myalgias.  Skin:  Positive for rash.  Neurological:  Negative for dizziness and headaches.   Physical Exam Updated Vital Signs BP 113/77   Pulse 100   Temp 98.3 F (36.8 C) (Oral)   Resp (!) 22   Ht 5\' 8"  (1.727 m)   Wt 72.6 kg   LMP 07/01/2021   SpO2 100%   BMI 24.33 kg/m   Physical Exam Vitals and nursing note reviewed.  Constitutional:      General: She is not in acute distress.    Comments: Pleasant well-appearing 20 year old female in no acute distress able answer questions appropriately follow commands.   HENT:     Head: Normocephalic and atraumatic.     Nose: Nose normal.      Mouth/Throat:     Mouth: Mucous membranes are dry.  Eyes:     General: No scleral icterus. Cardiovascular:     Rate and Rhythm: Regular rhythm. Tachycardia present.     Pulses: Normal pulses.     Heart sounds: Normal heart sounds.  Pulmonary:     Effort: Pulmonary effort is normal. No respiratory distress.     Breath sounds: Normal breath sounds. No wheezing.  Abdominal:     Palpations: Abdomen is soft.     Tenderness: There is no abdominal tenderness. There is no right CVA tenderness, left CVA tenderness, guarding or rebound.  Musculoskeletal:     Cervical back: Normal range of motion.     Right lower leg: No edema.     Left lower leg: No edema.     Comments: Moves all 4 extremities.  Skin:    General: Skin is warm and dry.     Capillary Refill: Capillary refill takes less than 2 seconds.  Neurological:     Mental Status: She is alert. Mental status is at baseline.     Comments: Smile symmetric  Psychiatric:        Mood and Affect: Mood normal.        Behavior: Behavior normal.    ED Results / Procedures / Treatments   Labs (all labs ordered are listed, but only abnormal results are displayed) Labs Reviewed  BASIC METABOLIC PANEL - Abnormal; Notable for the following components:      Result Value   Potassium 3.4 (*)    Glucose, Bld 104 (*)    All other components within normal limits  CBC WITH DIFFERENTIAL/PLATELET  TROPONIN I (HIGH SENSITIVITY)    EKG None  Radiology DG Chest 2 View  Result Date: 07/02/2021 CLINICAL DATA:  Chest tightness EXAM: CHEST - 2 VIEW COMPARISON:  02/28/2021 FINDINGS: Lungs are clear.  No pleural effusion or pneumothorax. The heart is normal in size. Visualized osseous structures are within normal limits. IMPRESSION: Normal chest radiographs. Electronically Signed   By: 05/01/2021 M.D.   On: 07/02/2021 23:39    Procedures Procedures   Medications Ordered in ED Medications  sodium chloride 0.9 % bolus 500 mL (0 mLs Intravenous  Stopped 07/03/21 13/11/22)    ED Course  I have reviewed the triage vital signs and the nursing notes.  Pertinent labs & imaging results that were available during my care of the patient were reviewed by me and considered in my medical decision making (see chart for details).  Clinical Course as of 07/03/21 0434  Fri Jul 03, 2021  0254 Pulse Rate: 99 [WF]  0254 BP: 118/86 [WF]  0254 Resp: 19 [WF]  0254 SpO2: 98 % [WF]    Clinical Course User Index [WF] Gailen Shelter, PA   MDM Rules/Calculators/A&P                          Patient is 20 year old female presented to the ER today with complaint of concern about allergic reaction.  She states that sometime around 3 PM 07/02/2021 she developed a rash on her hands and arms she states that she has been diagnosed with anaphylaxis and began feeling short of breath and tight in her chest she states that this all occurred after her mental health professional/psychiatrist/psychologist (?)  Saw her.  She thinks that she may have had a reaction to the perfume that this practitioner was wearing.  She states that she has persistent issues of nausea and vomiting due to gastroparesis.  She states that she is also being evaluated for Ehlers-Danlos syndrome   She has had persistent nausea is a symptom she is struggled with for quite some time.  On physical exam patient is well-appearing apart from being mildly tachycardic she is vital signs within normal limits   I personally reviewed all laboratory work and imaging.  Metabolic panel without any acute abnormality specifically kidney function within normal limits and no significant electrolyte abnormalities. CBC without leukocytosis or significant anemia.  Troponin x1 within normal limits.  Patient is not having chest pains much of some chest tightness Very unlikely to be ACS.  Doubt myocarditis.  EKG nonischemic chest x-ray unremarkable.  Seems the patient's primary complaint at this time is that  she feels that she has to think about breathing.  I encouraged patient to breathe normally how she sees fit and not to think too deeply about her breathing.  She is being monitored at this time I think this is a very reasonable plan.  On my reevaluation patient states that she feels somewhat improved.  Tachycardia has resolved with 500 mL of normal saline  Ambulatory time of discharge will discharge home with close follow-up with her specialists and return precautions.   Final Clinical Impression(s) / ED Diagnoses Final diagnoses:  Other fatigue  Hives  Dehydration  Nausea    Rx / DC Orders ED Discharge Orders          Ordered    ondansetron (ZOFRAN ODT) 4 MG disintegrating tablet  Every 8 hours PRN        07/03/21 0252             Gailen Shelter, PA 07/03/21 0437    Paula Libra, MD 07/03/21 [phone]

## 2021-09-03 ENCOUNTER — Ambulatory Visit: Payer: 59 | Admitting: Family Medicine

## 2021-09-03 ENCOUNTER — Encounter: Payer: Self-pay | Admitting: Family Medicine

## 2021-09-03 NOTE — Progress Notes (Deleted)
No chief complaint on file.    HISTORY OF PRESENT ILLNESS:  09/03/21 ALL:  Amy Bradley is a 21 y.o. female here today for follow up for migraine with aura. She was seen in consult with Dr Lucia GaskinsAhern 02/2021. She had continued divalproex ER 500mg  daily managed by PCP. Dr Lucia GaskinsAhern started rizatriptan and ondansetron for acute management.   Tried and failed: Depakote, Lexapro, pindolol, Abilify and Geodon, Prozac, Zoloft, amitriptyline/nortriptyline, emgality with a rash in the area of the injection but no anaphylaxis, Toradol, rizatriptan, ondansetron, Reglan, Benadryl, promethazine, meclizine, naproxen, Ubrelvy did not work   HISTORY (copied from Dr Trevor MaceAhern's previous note)  HPI:  Amy FiremanCassidy F West is a 21 y.o. female here as requested by Dois Davenportichter, Karen L, MD for here as requested by Georgianne Fickamachandran, Ajith, MD for headache.  Past medical history orthostatic hypotension, bipolar disorder, schizoaffective disorder, other fatigue, chronic migraine without aura, dehydration, nausea and vomiting, somnolence, unspecified visual loss, POTS.   Patient presents today with migraines, onset 2 years ago, off and on, "brainstem migraines", patient states she gets disoriented with her migraines.  Startes with pain between the eyes, she has neurological symptoms with the migraines, clumsy, sluring words, disoriented and confused, stabbing/pulsating/pounds, radiates to behind both eyes, lots of pressure, phono>photophobia, nausea, vomits, movement makes makes it worse, sitting in a dark room makes it better, can be severe so painful can black out. She noticed worsening when she decreased her Depakote now taking 500 mg, (I did mention depakote has significant teratogenicity associated, she is aware), patient's GP had prescribed an injection but she is unable to recall which 1, she had to stop due to an allergic reaction, she takes occasional Tylenol with no major relief, she has syncopal episodes due to her POTS.She  hasnot had a migraine in a few months but usually 2 a month. She tried tylenol, Emgality gave her a rash. Lasted up to 12 hours. Havent had one in a few months, no new symptoms over the last few years, not exertional or positional.    Reviewed notes, labs and imaging from outside physicians, which showed:   FINDINGS: Brain: No evidence of acute infarction, hemorrhage, hydrocephalus, extra-axial collection, visible mass lesion or mass effect. Basal cisterns are patent. Midline intracranial structures are unremarkable. Cerebellar tonsils are normally positioned.   Vascular: Age advanced atherosclerotic calcification is seen of the intradural vertebral arteries.   Skull: No calvarial fracture or suspicious osseous lesion. No scalp swelling or hematoma.   Sinuses/Orbits: Paranasal sinuses and mastoid air cells are predominantly clear. Included orbital structures are unremarkable.   Other: None   CT head 07/11/2020: personally reviewed and agree with findings 1. No acute intracranial abnormality. 2. 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     From a thorough review of records, medications tried that can be used in headache/migraine management include: Depakote, Lexapro, Zofran, pindolol, Abilify and Geodon, Prozac, promethazine,benadryl, toradol, meclizine, reglan, naproxen, zoloft, amitriptyline/nortriptyline, emgality with a rash in the area of the injection but no anaphylaxis, Bernita RaisinUbrelvy has not tried nurtec   REVIEW OF SYSTEMS: Out of a complete 14 system review of symptoms, the patient complains only of the following symptoms, and all other reviewed systems are negative.   ALLERGIES: Allergies  Allergen Reactions   Amoxicillin Anaphylaxis   Eggs Or Egg-Derived Products Anaphylaxis and Hives   Penicillins Anaphylaxis    Has patient had a PCN reaction causing immediate rash, facial/tongue/throat swelling, SOB or lightheadedness with  hypotension: No Has  patient had a PCN reaction causing severe rash involving mucus membranes or skin necrosis: No Has patient had a PCN reaction that required hospitalization: No Has patient had a PCN reaction occurring within the last 10 years: no If all of the above answers are "NO", then may proceed with Cephalosporin use.    Sulfa Antibiotics Anaphylaxis     HOME MEDICATIONS: Outpatient Medications Prior to Visit  Medication Sig Dispense Refill   divalproex (DEPAKOTE ER) 500 MG 24 hr tablet Take 1 tablet by mouth at bedtime.     escitalopram (LEXAPRO) 10 MG tablet Take 20 mg by mouth daily.     fludrocortisone (FLORINEF) 0.1 MG tablet Take 0.05 mg by mouth daily.     LORazepam (ATIVAN) 0.5 MG tablet Take 0.5 mg by mouth at bedtime as needed for anxiety or sleep.     ondansetron (ZOFRAN ODT) 4 MG disintegrating tablet Take 1 tablet (4 mg total) by mouth every 8 (eight) hours as needed for nausea or vomiting. 20 tablet 0   ondansetron (ZOFRAN) 4 MG tablet Take 1-2 tablets (4-8 mg total) by mouth every 6 (six) hours. 20 tablet 6   pantoprazole (PROTONIX) 40 MG tablet Take 40 mg by mouth daily.     pindolol (VISKEN) 5 MG tablet Take 2.5 mg by mouth 2 (two) times daily.      rizatriptan (MAXALT-MLT) 10 MG disintegrating tablet Take 1 tablet (10 mg total) by mouth as needed for migraine. May repeat in 2 hours if needed (Patient not taking: Reported on 07/03/2021) 9 tablet 11   traZODone (DESYREL) 50 MG tablet Take 25 mg by mouth at bedtime as needed for sleep.     No facility-administered medications prior to visit.     PAST MEDICAL HISTORY: Past Medical History:  Diagnosis Date   Anxiety    Bipolar 1 disorder (HCC)    Chronic fatigue    Depression    POTS (postural orthostatic tachycardia syndrome)      PAST SURGICAL HISTORY: No past surgical history on file.   FAMILY HISTORY: Family History  Problem Relation Age of Onset   Depression Mother    Lung cancer Father     Depression Father    Bipolar disorder Brother    Anxiety disorder Maternal Grandfather    Depression Maternal Grandfather    Heart disease Neg Hx    Sudden Cardiac Death Neg Hx      SOCIAL HISTORY: Social History   Socioeconomic History   Marital status: Single    Spouse name: Not on file   Number of children: Not on file   Years of education: Not on file   Highest education level: Some college, no degree  Occupational History   Not on file  Tobacco Use   Smoking status: Never   Smokeless tobacco: Never  Vaping Use   Vaping Use: Never used  Substance and Sexual Activity   Alcohol use: No   Drug use: No   Sexual activity: Never    Birth control/protection: None  Other Topics Concern   Not on file  Social History Narrative   Lives with roommates   Right handed   Caffeine: none   Social Determinants of Health   Financial Resource Strain: Not on file  Food Insecurity: Not on file  Transportation Needs: Not on file  Physical Activity: Not on file  Stress: Not on file  Social Connections: Not on file  Intimate Partner Violence: Not on file     PHYSICAL EXAM  There were no vitals filed for this visit. There is no height or weight on file to calculate BMI.  Generalized: Well developed, in no acute distress  Cardiology: normal rate and rhythm, no murmur auscultated  Respiratory: clear to auscultation bilaterally    Neurological examination  Mentation: Alert oriented to time, place, history taking. Follows all commands speech and language fluent Cranial nerve II-XII: Pupils were equal round reactive to light. Extraocular movements were full, visual field were full on confrontational test. Facial sensation and strength were normal. Uvula tongue midline. Head turning and shoulder shrug  were normal and symmetric. Motor: The motor testing reveals 5 over 5 strength of all 4 extremities. Good symmetric motor tone is noted throughout.  Sensory: Sensory testing is intact  to soft touch on all 4 extremities. No evidence of extinction is noted.  Coordination: Cerebellar testing reveals good finger-nose-finger and heel-to-shin bilaterally.  Gait and station: Gait is normal. Tandem gait is normal. Romberg is negative. No drift is seen.  Reflexes: Deep tendon reflexes are symmetric and normal bilaterally.    DIAGNOSTIC DATA (LABS, IMAGING, TESTING) - I reviewed patient records, labs, notes, testing and imaging myself where available.  Lab Results  Component Value Date   WBC 7.4 07/02/2021   HGB 13.6 07/02/2021   HCT 39.8 07/02/2021   MCV 90.9 07/02/2021   PLT 312 07/02/2021      Component Value Date/Time   NA 138 07/02/2021 2254   K 3.4 (L) 07/02/2021 2254   CL 108 07/02/2021 2254   CO2 22 07/02/2021 2254   GLUCOSE 104 (H) 07/02/2021 2254   BUN 15 07/02/2021 2254   CREATININE 0.69 07/02/2021 2254   CALCIUM 9.1 07/02/2021 2254   PROT 7.4 07/10/2020 2347   ALBUMIN 4.7 07/10/2020 2347   AST 18 07/10/2020 2347   ALT 16 07/10/2020 2347   ALKPHOS 60 07/10/2020 2347   BILITOT 0.2 (L) 07/10/2020 2347   GFRNONAA >60 07/02/2021 2254   GFRAA >60 07/01/2018 0352   No results found for: CHOL, HDL, LDLCALC, LDLDIRECT, TRIG, CHOLHDL No results found for: CXKG8J No results found for: VITAMINB12 Lab Results  Component Value Date   TSH 1.284 06/19/2017    No flowsheet data found.   No flowsheet data found.   ASSESSMENT AND PLAN  21 y.o. year old female  has a past medical history of Anxiety, Bipolar 1 disorder (HCC), Chronic fatigue, Depression, and POTS (postural orthostatic tachycardia syndrome). here with    No diagnosis found.   No orders of the defined types were placed in this encounter.    No orders of the defined types were placed in this encounter.     Shawnie Dapper, MSN, FNP-C 09/03/2021, 7:20 AM  [facility] [address], [address] [place], [place] [address] [phone]

## 2021-09-03 NOTE — Patient Instructions (Incomplete)
Below is our plan: ? ?We will *** ? ?Please make sure you are staying well hydrated. I recommend 50-60 ounces daily. Well balanced diet and regular exercise encouraged. Consistent sleep schedule with 6-8 hours recommended.  ? ?Please continue follow up with care team as directed.  ? ?Follow up with *** in *** ? ?You may receive a survey regarding today's visit. I encourage you to leave honest feed back as I do use this information to improve patient care. Thank you for seeing me today!  ? ?Migraine with aura: There is increased risk for stroke in women with migraine with aura and a contraindication for the combined contraceptive pill for use by women who have migraine with aura. The risk for women with migraine without aura is lower. However other risk factors like smoking are far more likely to increase stroke risk than migraine. There is a recommendation for no smoking and for the use of OCPs without estrogen such as progestogen only pills particularly for women with migraine with aura.. People who have migraine headaches with auras may be 3 times more likely to have a stroke caused by a blood clot, compared to migraine patients who don't see auras. Women who take hormone-replacement therapy may be 30 percent more likely to suffer a clot-based stroke than women not taking medication containing estrogen. Other risk factors like smoking and high blood pressure may be  much more important.  ? ?Magnesium: may help with headaches are aura, the best evidence for magnesium is for migraine with aura is its thought to stop the cortical spreading depression we believe is the pathophysiology of migraine aura. ? ?To prevent or relieve headaches, try the following: ?Cool Compress. Lie down and place a cool compress on your head.  ?Avoid headache triggers. If certain foods or odors seem to have triggered your migraines in the past, avoid them. A headache diary might help you identify triggers.  ?Include physical activity in  your daily routine. Try a daily walk or other moderate aerobic exercise.  ?Manage stress. Find healthy ways to cope with the stressors, such as delegating tasks on your to-do list.  ?Practice relaxation techniques. Try deep breathing, yoga, massage and visualization.  ?Eat regularly. Eating regularly scheduled meals and maintaining a healthy diet might help prevent headaches. Also, drink plenty of fluids.  ?Follow a regular sleep schedule. Sleep deprivation might contribute to headaches ?Consider biofeedback. With this mind-body technique, you learn to control certain bodily functions -- such as muscle tension, heart rate and blood pressure -- to prevent headaches or reduce headache pain.  ?  ?  ?Proceed to emergency room if you experience new or worsening symptoms or symptoms do not resolve, if you have new neurologic symptoms or if headache is severe, or for any concerning symptom.  ?

## 2022-01-07 ENCOUNTER — Emergency Department (HOSPITAL_BASED_OUTPATIENT_CLINIC_OR_DEPARTMENT_OTHER)
Admission: EM | Admit: 2022-01-07 | Discharge: 2022-01-08 | Disposition: A | Discharge status: Home / Self Care | Payer: Self-pay | Attending: Emergency Medicine | Admitting: Emergency Medicine

## 2022-01-07 DIAGNOSIS — T7840XA Allergy, unspecified, initial encounter: Secondary | ICD-10-CM | POA: Insufficient documentation

## 2022-01-07 DIAGNOSIS — E876 Hypokalemia: Secondary | ICD-10-CM | POA: Insufficient documentation

## 2022-01-07 DIAGNOSIS — R55 Syncope and collapse: Secondary | ICD-10-CM | POA: Insufficient documentation

## 2022-01-07 HISTORY — DX: Gastroparesis: K31.84

## 2022-01-07 NOTE — ED Triage Notes (Signed)
Pt brought in by [facility] with c/o allergic reaction  Pt does not know cause  Pt does not have an epi pen at home  Pt states same happened a week ago  Pt states she passed out three times at home  Pt was laying on the floor under a blanket when EMS arrived and would not talk to EMS staff  Pt is alert and oriented x 3  Skin warm and dry  Lungs clear  No rash or redness noted to skin  No resp distress noted

## 2022-01-08 ENCOUNTER — Encounter (HOSPITAL_BASED_OUTPATIENT_CLINIC_OR_DEPARTMENT_OTHER): Payer: Self-pay | Admitting: Emergency Medicine

## 2022-01-08 LAB — CBC
HCT: 37 % (ref 36.0–46.0)
Hemoglobin: 12.7 g/dL (ref 12.0–15.0)
MCH: 31.4 pg (ref 26.0–34.0)
MCHC: 34.3 g/dL (ref 30.0–36.0)
MCV: 91.6 fL (ref 80.0–100.0)
Platelets: 306 10*3/uL (ref 150–400)
RBC: 4.04 MIL/uL (ref 3.87–5.11)
RDW: 11.9 % (ref 11.5–15.5)
WBC: 6.4 10*3/uL (ref 4.0–10.5)
nRBC: 0 % (ref 0.0–0.2)

## 2022-01-08 LAB — BASIC METABOLIC PANEL
Anion gap: 8 (ref 5–15)
BUN: 12 mg/dL (ref 6–20)
CO2: 23 mmol/L (ref 22–32)
Calcium: 9.3 mg/dL (ref 8.9–10.3)
Chloride: 107 mmol/L (ref 98–111)
Creatinine, Ser: 0.77 mg/dL (ref 0.44–1.00)
GFR, Estimated: >60 mL/min (ref >60)
Glucose, Bld: 110 mg/dL — ABNORMAL HIGH (ref 70–99)
Potassium: 3.1 mmol/L — ABNORMAL LOW (ref 3.5–5.1)
Sodium: 138 mmol/L (ref 135–145)

## 2022-01-08 LAB — PREGNANCY, URINE: Preg Test, Ur: NEGATIVE

## 2022-01-08 MED ORDER — EPINEPHRINE 0.3 MG/0.3ML IJ SOAJ
0.3000 mg | INTRAMUSCULAR | 1 refills | Status: AC | PRN
Start: 2022-01-08 — End: ?

## 2022-01-08 MED ORDER — POTASSIUM CHLORIDE CRYS ER 20 MEQ PO TBCR
40.0000 meq | EXTENDED_RELEASE_TABLET | Freq: Once | ORAL | Status: AC
  Administered 2022-01-08: 40 meq via ORAL
  Filled 2022-01-08: qty 2

## 2022-01-08 MED ORDER — SODIUM CHLORIDE 0.9 % IV BOLUS
1000.0000 mL | Freq: Once | INTRAVENOUS | Status: AC
  Administered 2022-01-08: 1000 mL via INTRAVENOUS

## 2022-01-08 MED ORDER — LACTATED RINGERS IV BOLUS
1000.0000 mL | Freq: Once | INTRAVENOUS | Status: AC
  Administered 2022-01-08: 1000 mL via INTRAVENOUS

## 2022-01-08 NOTE — ED Notes (Signed)
During orthostatics when pt was set up from lying flat pt complained of being dizzy. After sitting orthostatic vitals was taken pt stood up. Pt is still complaining of dizziness. Pt immediately sat back down on the bed and feels she is too dizzy to do orthostatic vitals standing up. Pt is laid back in the bed and covered up with a blanket.

## 2022-01-08 NOTE — ED Provider Notes (Signed)
[facility] EMERGENCY DEPARTMENT Provider Note   CSN: [id number] Arrival date & time: 01/07/22  2349     History  Chief Complaint  Patient presents with   Allergic Reaction    Amy FiremanCassidy F Bradley is a 21 y.o. female.  The history is provided by the patient.  Allergic Reaction Presenting symptoms: itching and rash   Relieved by:  Antihistamines  Patient presents for concern for allergic reaction.  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  Tonight she started having hives and then began feeling short of breath and chest pain.  This is typical for her reactions.  She reports she had a brief syncopal episode in her room and called 911.  She took Benadryl and now her rash is resolving.  It is unclear what triggers her reactions.  She does not have an EpiPen at home.  She is trying to obtain an allergist but due to lack of insurance this has become a challenge.  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 Authorizing Provider  EPINEPHrine 0.3 mg/0.3 mL IJ SOAJ injection Inject 0.3 mg into the muscle as needed for anaphylaxis. 01/08/22  Yes Zadie RhineWickline, Avni Traore, MD  divalproex (DEPAKOTE ER) 500 MG 24 hr tablet Take 1 tablet by mouth at bedtime. 08/31/19   [provider]  escitalopram (LEXAPRO) 10 MG tablet Take 20 mg by mouth daily. 02/24/21   [provider]  fludrocortisone (FLORINEF) 0.1 MG tablet Take 0.05 mg by mouth daily. 02/12/21   [provider]  LORazepam (ATIVAN) 0.5 MG tablet Take 0.5 mg by mouth at bedtime as needed for anxiety or sleep. 06/04/21   [provider]  ondansetron (ZOFRAN ODT) 4 MG disintegrating tablet Take 1 tablet (4 mg total) by mouth every 8 (eight) hours as needed for nausea  or vomiting. 07/03/21   Fondaw, Rodrigo RanWylder S, PA  ondansetron (ZOFRAN) 4 MG tablet Take 1-2 tablets (4-8 mg total) by mouth every 6 (six) hours. 03/03/21   Anson FretAhern, Antonia B, MD  pantoprazole (PROTONIX) 40 MG tablet Take 40 mg by mouth daily. 12/11/20   [provider]  pindolol (VISKEN) 5 MG tablet Take 2.5 mg by mouth 2 (two) times daily.     [provider]  rizatriptan (MAXALT-MLT) 10 MG disintegrating tablet Take 1 tablet (10 mg total) by mouth as needed for migraine. May repeat in 2 hours if needed Patient not taking: Reported on 07/03/2021 03/03/21   Anson FretAhern, Antonia B, MD  traZODone (DESYREL) 50 MG tablet Take 25 mg by mouth at bedtime as needed for sleep. 01/20/21   [provider]      Allergies    Amoxicillin, Eggs or egg-derived products, Penicillins, Red dye, and Sulfa antibiotics    Review of Systems   Review of Systems  Respiratory:  Positive for shortness of breath.   Cardiovascular:  Positive for chest pain.  Skin:  Positive for itching and rash.  Neurological:  Positive for syncope.   Physical Exam Updated Vital Signs BP 110/77   Pulse 75   Temp 98.2 F (36.8 C) (Oral)   Resp 20   Ht 1.727 m (5\' 8" )   LMP 01/06/2022   SpO2 100%   BMI 24.33 kg/m  Physical Exam CONSTITUTIONAL: Well developed/well nourished HEAD:  Normocephalic/atraumatic EYES: EOMI/PERRL ENMT: Mucous membranes moist, no angioedema NECK: supple no meningeal signs SPINE/BACK:entire spine nontender CV: S1/S2 noted, no murmurs/rubs/gallops noted LUNGS: Lungs are clear to auscultation bilaterally, no apparent distress ABDOMEN: soft, nontender, no rebound or guarding, bowel sounds noted throughout abdomen GU:no cva tenderness NEURO: Pt is awake/alert/appropriate, moves all extremitiesx4.  No facial droop.  No arm or leg drift EXTREMITIES: pulses normal/equal, full ROM SKIN: warm, color normal no rash PSYCH: no abnormalities of mood noted, alert and oriented to situation  ED  Results / Procedures / Treatments   Labs (all labs ordered are listed, but only abnormal results are displayed) Labs Reviewed  BASIC METABOLIC PANEL - Abnormal; Notable for the following components:      Result Value   Potassium 3.1 (*)    Glucose, Bld 110 (*)    All other components within normal limits  CBC  PREGNANCY, URINE    EKG EKG Interpretation  Date/Time:  Friday Jan 08 2022 00:49:27 EDT Ventricular Rate:  79 PR Interval:  140 QRS Duration: 89 QT Interval:  391 QTC Calculation: 449 R Axis:   91 Text Interpretation: Sinus rhythm Borderline right axis deviation Confirmed by Zadie Rhine ([id number]) on 01/08/2022 12:57:11 AM  Radiology No results found.  Procedures Procedures    Medications Ordered in ED Medications  sodium chloride 0.9 % bolus 1,000 mL ( Intravenous Stopped 01/08/22 0145)  lactated ringers bolus 1,000 mL (1,000 mLs Intravenous New Bag/Given 01/08/22 0203)  potassium chloride SA (KLOR-CON M) CR tablet 40 mEq (40 mEq Oral Given 01/08/22 0200)    ED Course/ Medical Decision Making/ A&P Clinical Course as of 01/08/22 0255  Fri Jan 08, 2022  0255 Patient improved.  No signs of anaphylaxis at this time.  No indication for an EpiPen at this time.  She is ambulatory.  Will discharge [DW]    Clinical Course User Index [DW] Zadie Rhine, MD                           Medical Decision Making Amount and/or Complexity of Data Reviewed Labs: ordered. ECG/medicine tests: ordered.  Risk Prescription drug management.   This patient presents to the ED for concern of drug reaction, this involves an extensive number of treatment options, and is a complaint that carries with it a high risk of complications and morbidity.  The differential diagnosis includes but is not limited to anaphylaxis, urticaria  Comorbidities that complicate the patient evaluation: Patient's presentation is complicated by their history of POTS syndrome  Social Determinants of  Health: Patient's lack of insurance and lack of prescription access  increases the complexity of managing their presentation  Additional history obtained: Additional history obtained from  roommate   Lab Tests: I Ordered, and personally interpreted labs.  The pertinent results include: Mild hypokalemia   Cardiac Monitoring: The patient was maintained on a cardiac monitor.  I personally viewed and interpreted the cardiac monitor which showed an underlying rhythm of:  sinus rhythm  Medicines ordered and prescription drug management: I ordered medication including IV fluids for syncope and dehydration Reevaluation of the patient after these medicines showed that the patient    improved   Critical Interventions:  IV fluids :   Reevaluation: After the interventions noted above, I reevaluated the patient and found that they have :improved  Complexity of problems addressed: Patient's presentation is most consistent with  acute presentation with potential threat to life or bodily function  Disposition:  After consideration of the diagnostic results and the patient's response to treatment,  I feel that the patent would benefit from discharge   .           Final Clinical Impression(s) / ED Diagnoses Final diagnoses:  Allergic reaction, initial encounter  Syncope and collapse    Rx / DC Orders ED Discharge Orders          Ordered    EPINEPHrine 0.3 mg/0.3 mL IJ SOAJ injection  As needed        01/08/22 0152              Zadie Rhine, MD 01/08/22 [phone]

## 2023-03-22 IMAGING — CT CT ANGIO CHEST
2 of 6 series · 18 of 36 positions shown · IV contrast (omnipaque)
Comparison: Radiograph 02/28/2021

CLINICAL DATA: Upper respiratory infection for 1 week, developing
chest pain and shortness of breath

EXAM:
CT ANGIOGRAPHY CHEST WITH CONTRAST
TECHNIQUE: Multidetector CT imaging of the chest was performed using the
standard protocol during bolus administration of intravenous
contrast. Multiplanar CT image reconstructions and MIPs were
obtained to evaluate the vascular anatomy.
CONTRAST:  80mL OMNIPAQUE IOHEXOL 350 MG/ML SOLN

[Series 5: thins · axial · 0.70mm/px · z∈[-328,-92]mm · 17 of 267 slices shown]
[im 15/267  lung]
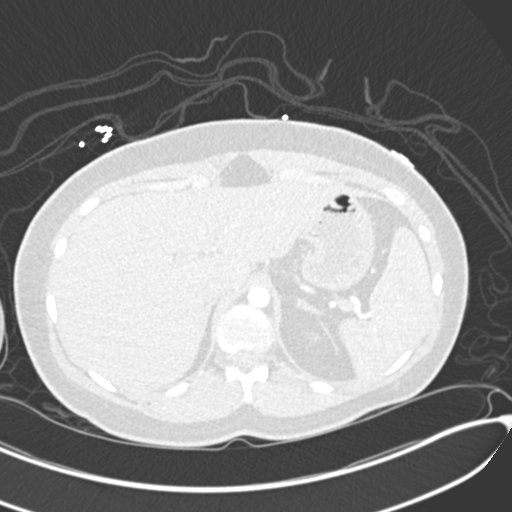
[im 30/267  mediastinal]
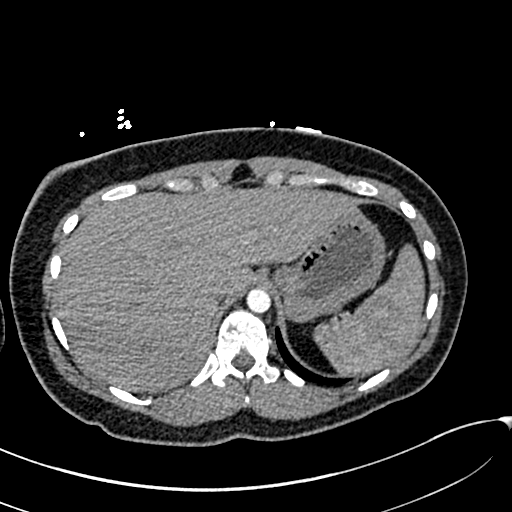
[im 45/267  lung]
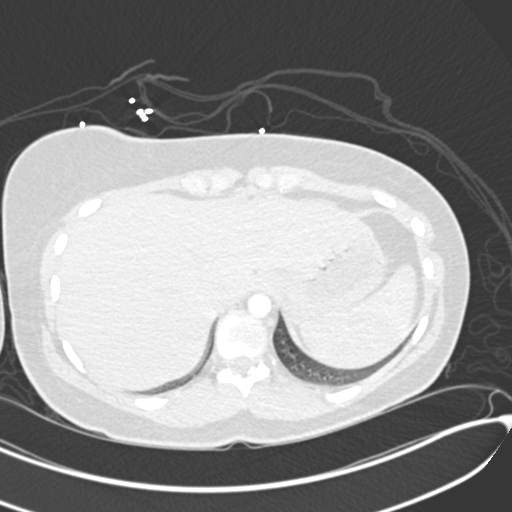
[im 60/267  mediastinal]
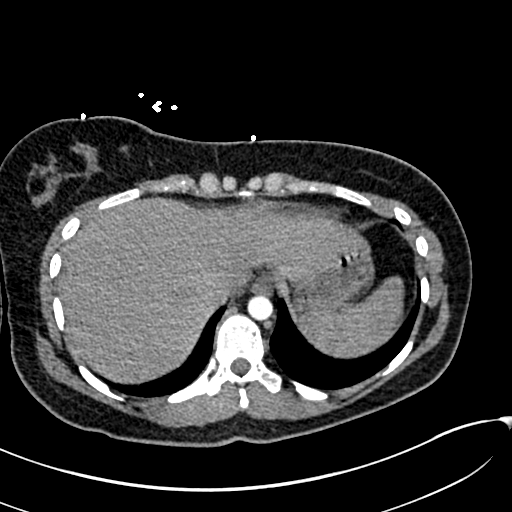
[im 74/267  lung]
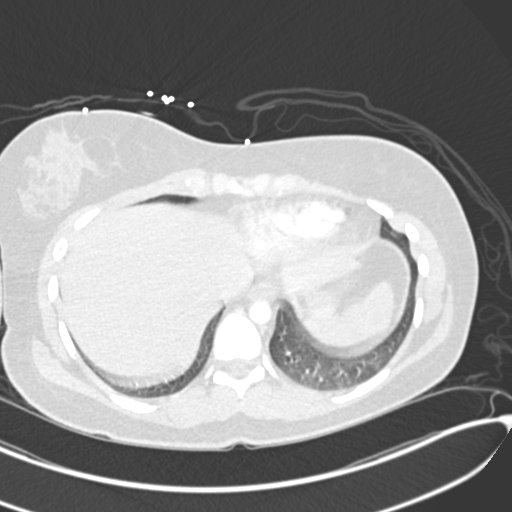
[im 89/267  mediastinal]
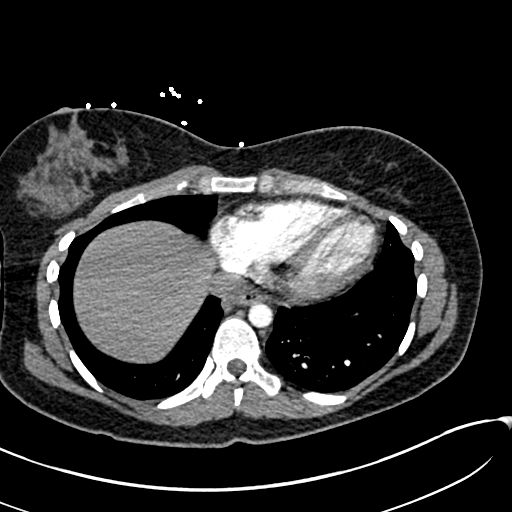
[im 104/267  lung]
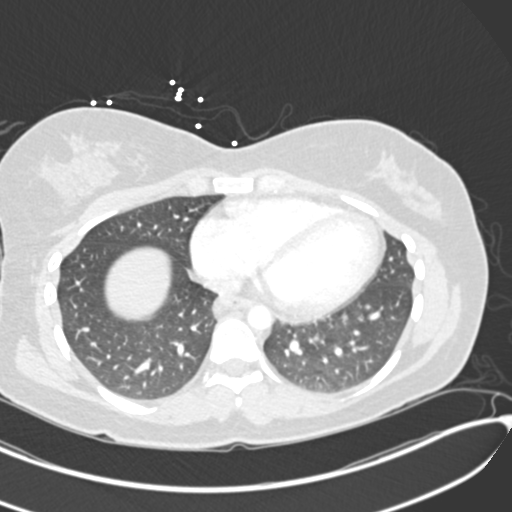
[im 119/267  mediastinal]
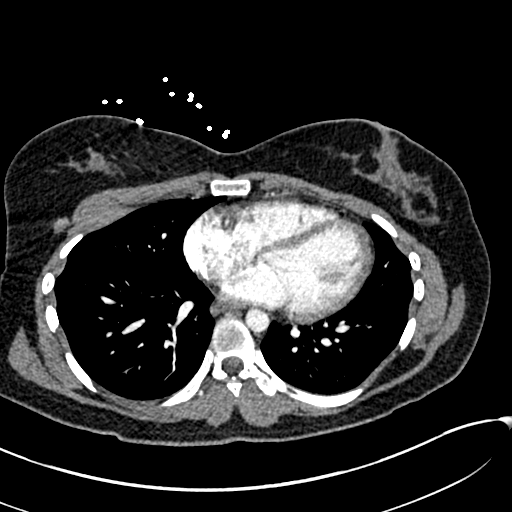
[im 134/267  lung]
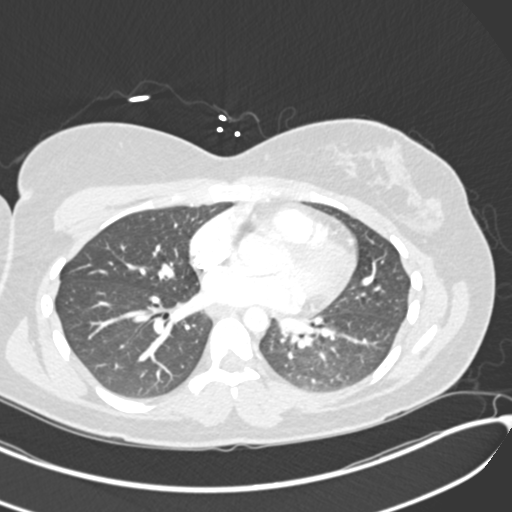
[im 148/267  mediastinal]
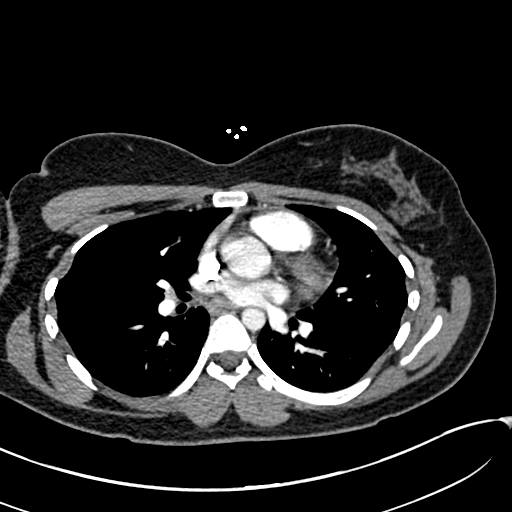
[im 163/267  lung]
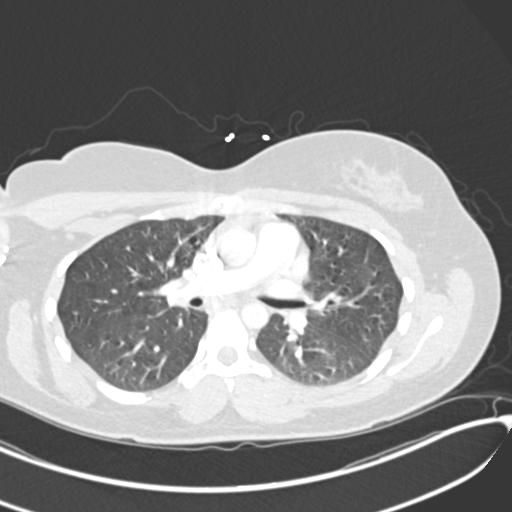
[im 178/267  mediastinal]
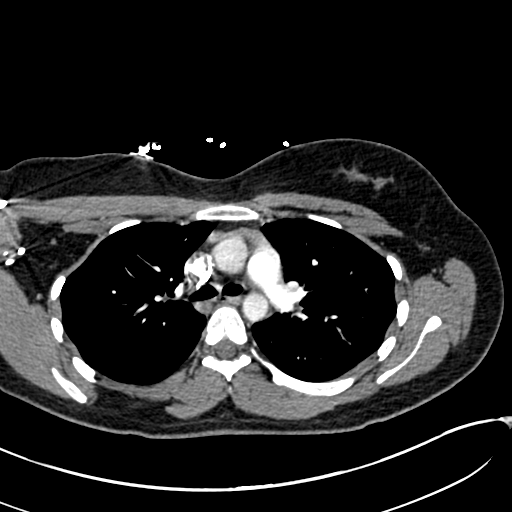
[im 193/267  lung]
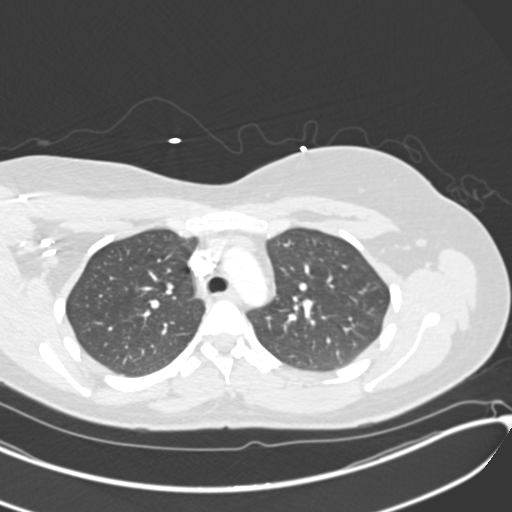
[im 207/267  mediastinal]
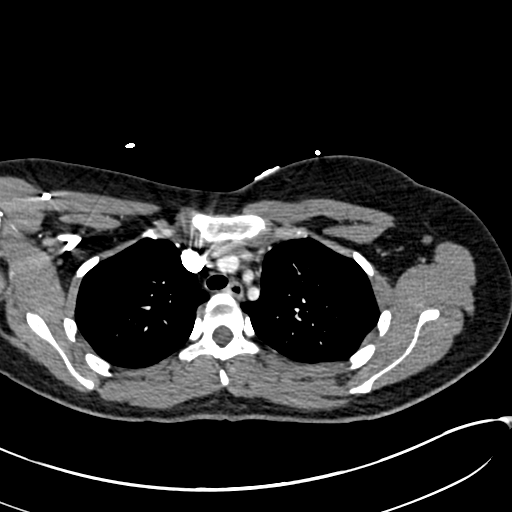
[im 222/267  lung]
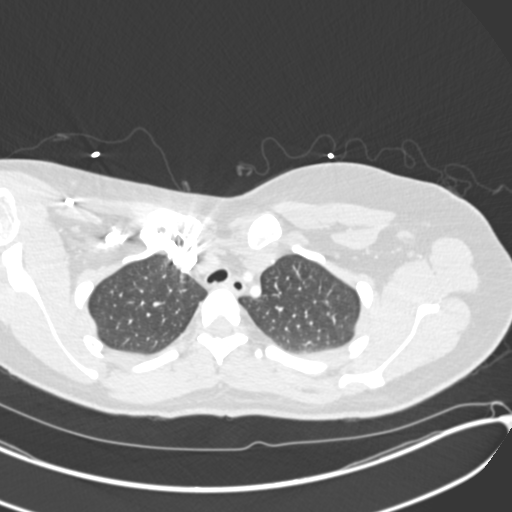
[im 237/267  mediastinal]
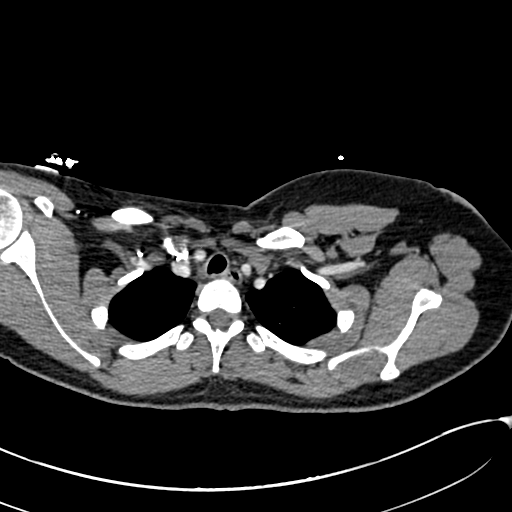
[im 252/267  lung]
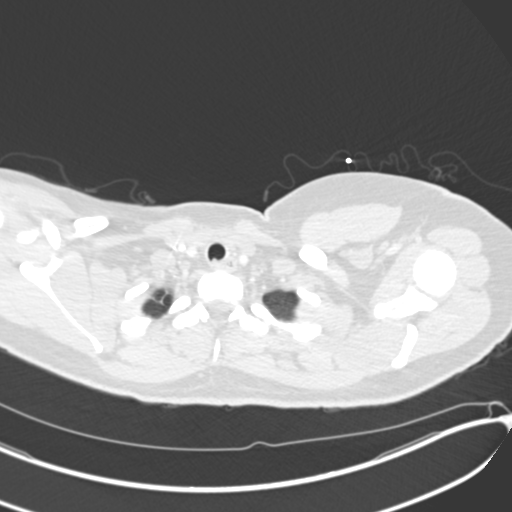

[Series 7: coronal mpr · coronal · 0.57mm/px · 1 of 129 slices shown]
[im 65/129  mediastinal]
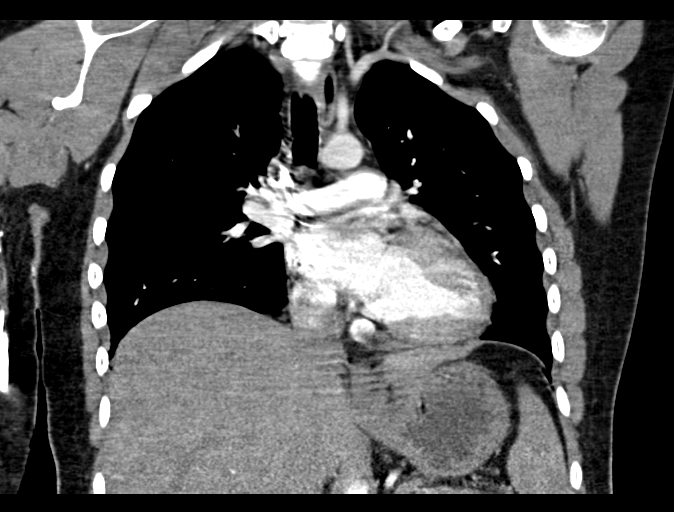

[18 of 36 positions shown; findings below may reference images not displayed]

FINDINGS: Cardiovascular: Satisfactory opacification the pulmonary arteries
however evaluation is limited by extensive respiratory motion
artifact which may limit detection of smaller segmental and
subsegmental filling defects. No central or lobar filling defects
are identified. Central pulmonary arteries are normal caliber. The
aortic root is suboptimally assessed given cardiac pulsation
artifact. Suboptimal opacification of the thoracic aorta. No gross
acute thoracic aortic abnormality. Normal caliber aorta. Normal
branching of the otherwise unremarkable great vessels. No major
venous abnormality or significant venous reflux.

Mediastinum/Nodes: No mediastinal fluid or gas. Normal thyroid gland
and thoracic inlet. No acute abnormality of the trachea or
esophagus. No worrisome mediastinal, hilar or axillary adenopathy.

Lungs/Pleura: Airways are patent. No consolidation, features of
edema, pneumothorax, or effusion. No suspicious pulmonary nodules or
masses.

Upper Abdomen: No acute abnormalities present in the visualized
portions of the upper abdomen.

Musculoskeletal: No acute osseous abnormality or suspicious osseous
lesion.

Review of the MIP images confirms the above findings.
IMPRESSION: 1. No evidence of pulmonary artery embolism to the lobar level with
more distal evaluation limited by respiratory motion artifact.
2. No other acute intrathoracic process.

## 2023-07-23 IMAGING — CR DG CHEST 2V
2 series · 2 of 2 positions shown · non-contrast
Comparison: 02/28/2021

CLINICAL DATA: Chest tightness

EXAM:
CHEST - 2 VIEW

[w chest lat]
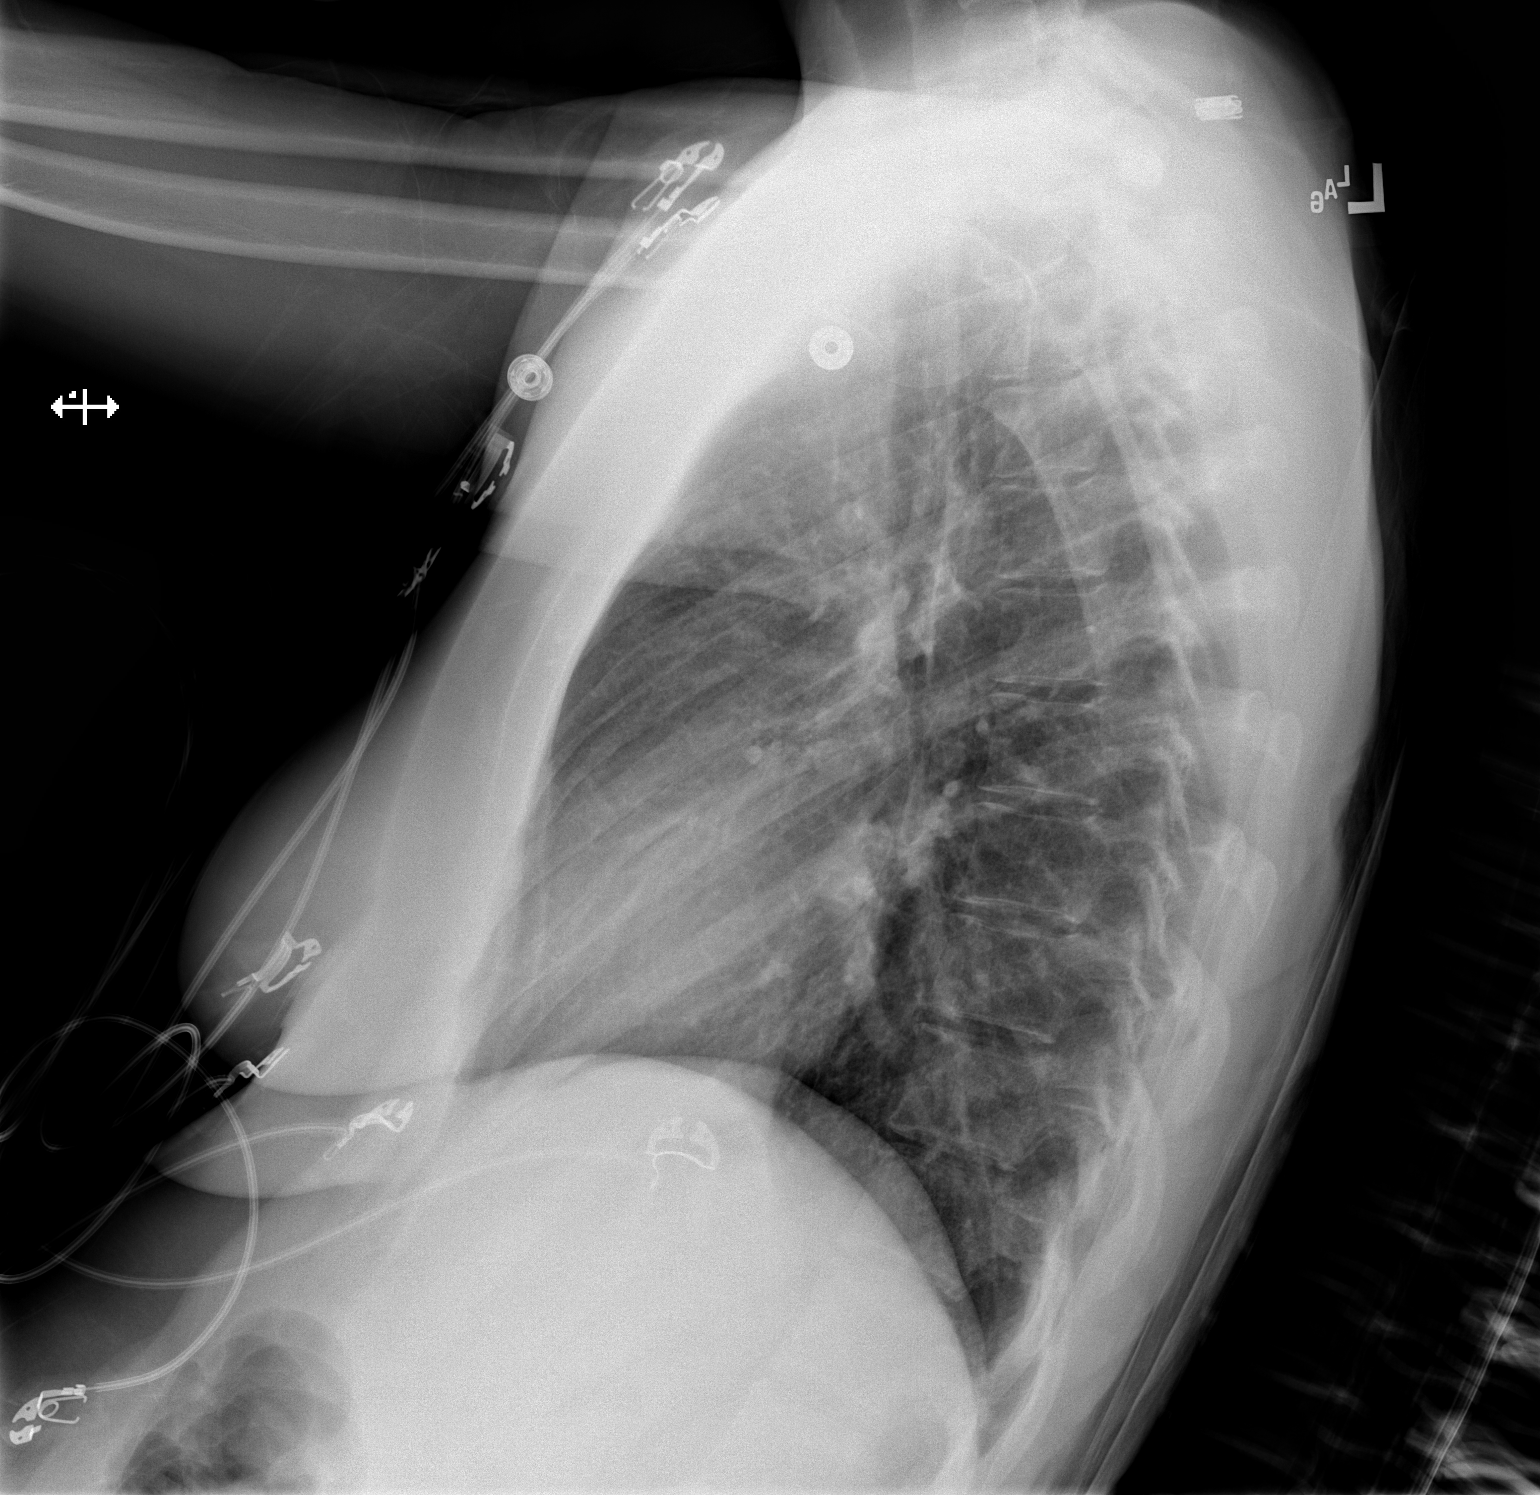

[x chest ap]
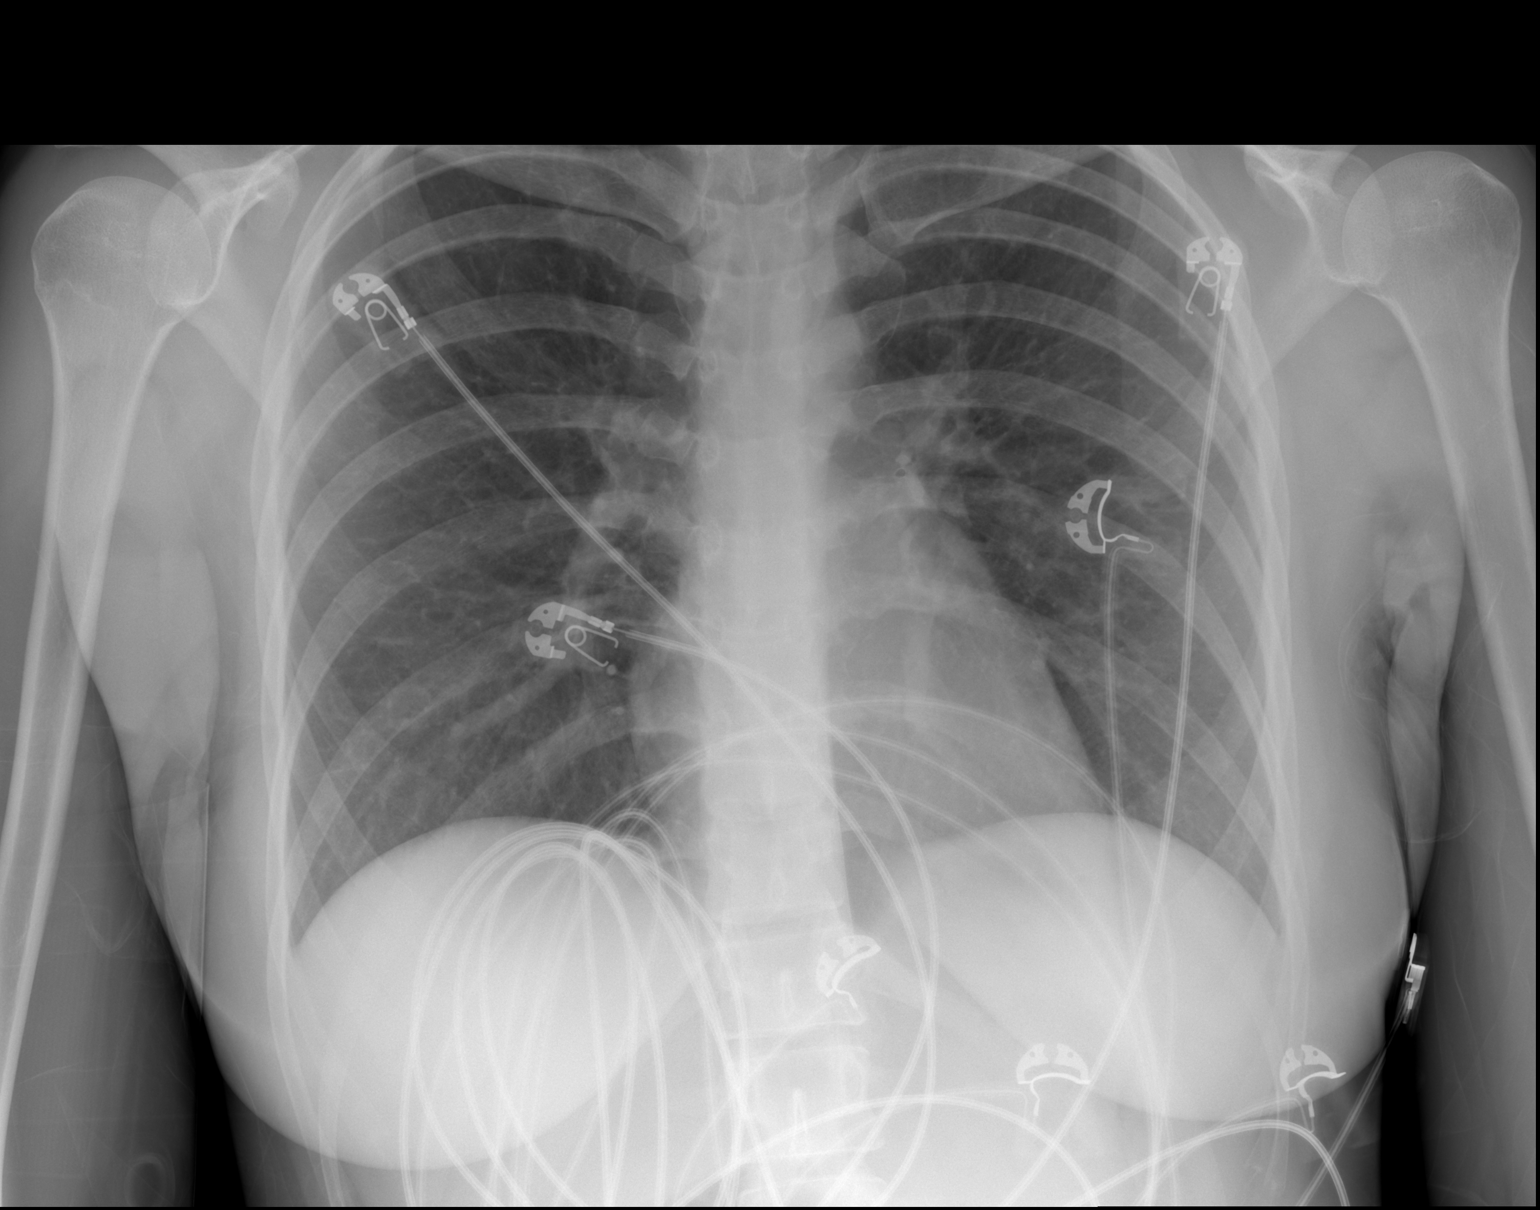

[2 of 2 positions shown; findings below may reference images not displayed]

FINDINGS: Lungs are clear.  No pleural effusion or pneumothorax.

The heart is normal in size.

Visualized osseous structures are within normal limits.
IMPRESSION: Normal chest radiographs.

## 2023-08-24 DEATH — deceased
